# Patient Record
Sex: Female | Born: 1970 | Race: White | Hispanic: No | Marital: Married | Smoking: Former smoker
Health system: Southern US, Community
[De-identification: ages and names within clinical notes are randomized; demographics above are authoritative.]

## PROBLEM LIST (undated history)

## (undated) DIAGNOSIS — F329 Major depressive disorder, single episode, unspecified: Secondary | ICD-10-CM

## (undated) DIAGNOSIS — F32A Depression, unspecified: Secondary | ICD-10-CM

## (undated) DIAGNOSIS — F419 Anxiety disorder, unspecified: Secondary | ICD-10-CM

## (undated) DIAGNOSIS — L709 Acne, unspecified: Secondary | ICD-10-CM

## (undated) DIAGNOSIS — J45909 Unspecified asthma, uncomplicated: Secondary | ICD-10-CM

## (undated) DIAGNOSIS — T7840XA Allergy, unspecified, initial encounter: Secondary | ICD-10-CM

## (undated) DIAGNOSIS — G43909 Migraine, unspecified, not intractable, without status migrainosus: Secondary | ICD-10-CM

## (undated) HISTORY — DX: Allergy, unspecified, initial encounter: T78.40XA

---

## 1998-10-31 ENCOUNTER — Other Ambulatory Visit: Admission: RE | Admit: 1998-10-31 | Discharge: 1998-10-31 | Payer: Self-pay | Admitting: Internal Medicine

## 1999-06-03 HISTORY — PX: LACERATION REPAIR: SHX5168

## 2004-07-19 ENCOUNTER — Other Ambulatory Visit: Admission: RE | Admit: 2004-07-19 | Discharge: 2004-07-19 | Payer: Self-pay | Admitting: Obstetrics and Gynecology

## 2005-01-17 ENCOUNTER — Inpatient Hospital Stay (HOSPITAL_COMMUNITY): Admission: AD | Admit: 2005-01-17 | Discharge: 2005-01-20 | Payer: Self-pay | Admitting: Obstetrics and Gynecology

## 2005-03-10 ENCOUNTER — Other Ambulatory Visit: Admission: RE | Admit: 2005-03-10 | Discharge: 2005-03-10 | Payer: Self-pay | Admitting: Obstetrics and Gynecology

## 2013-11-30 DIAGNOSIS — F419 Anxiety disorder, unspecified: Secondary | ICD-10-CM | POA: Insufficient documentation

## 2018-05-07 ENCOUNTER — Ambulatory Visit (HOSPITAL_COMMUNITY)
Admission: EM | Admit: 2018-05-07 | Discharge: 2018-05-07 | Disposition: A | Discharge status: Home / Self Care | Payer: BC Managed Care – PPO | Source: Home / Self Care | Attending: Family Medicine | Admitting: Family Medicine

## 2018-05-07 ENCOUNTER — Encounter (HOSPITAL_COMMUNITY): Payer: Self-pay

## 2018-05-07 ENCOUNTER — Other Ambulatory Visit: Payer: Self-pay

## 2018-05-07 ENCOUNTER — Emergency Department (HOSPITAL_COMMUNITY)
Admission: EM | Admit: 2018-05-07 | Discharge: 2018-05-08 | Disposition: A | Discharge status: Home / Self Care | Payer: BC Managed Care – PPO | Attending: Emergency Medicine | Admitting: Emergency Medicine

## 2018-05-07 DIAGNOSIS — N390 Urinary tract infection, site not specified: Secondary | ICD-10-CM | POA: Insufficient documentation

## 2018-05-07 DIAGNOSIS — N12 Tubulo-interstitial nephritis, not specified as acute or chronic: Secondary | ICD-10-CM

## 2018-05-07 DIAGNOSIS — N10 Acute tubulo-interstitial nephritis: Secondary | ICD-10-CM | POA: Diagnosis not present

## 2018-05-07 DIAGNOSIS — K7689 Other specified diseases of liver: Secondary | ICD-10-CM | POA: Insufficient documentation

## 2018-05-07 DIAGNOSIS — N83202 Unspecified ovarian cyst, left side: Secondary | ICD-10-CM | POA: Insufficient documentation

## 2018-05-07 DIAGNOSIS — Z79899 Other long term (current) drug therapy: Secondary | ICD-10-CM | POA: Insufficient documentation

## 2018-05-07 DIAGNOSIS — R1032 Left lower quadrant pain: Secondary | ICD-10-CM

## 2018-05-07 DIAGNOSIS — F419 Anxiety disorder, unspecified: Secondary | ICD-10-CM

## 2018-05-07 HISTORY — DX: Depression, unspecified: F32.A

## 2018-05-07 HISTORY — DX: Anxiety disorder, unspecified: F41.9

## 2018-05-07 HISTORY — DX: Major depressive disorder, single episode, unspecified: F32.9

## 2018-05-07 LAB — POCT URINALYSIS DIP (DEVICE)
Bilirubin Urine: NEGATIVE
Glucose, UA: NEGATIVE mg/dL
Ketones, ur: NEGATIVE mg/dL
Nitrite: POSITIVE — AB
Protein, ur: 100 mg/dL — AB
Specific Gravity, Urine: 1.025 (ref 1.005–1.030)
Urobilinogen, UA: 0.2 mg/dL (ref 0.0–1.0)
pH: 6 (ref 5.0–8.0)

## 2018-05-07 LAB — COMPREHENSIVE METABOLIC PANEL
ALT: 25 U/L (ref 0–44)
AST: 25 U/L (ref 15–41)
Albumin: 3.9 g/dL (ref 3.5–5.0)
Alkaline Phosphatase: 49 U/L (ref 38–126)
Anion gap: 13 (ref 5–15)
BUN: 13 mg/dL (ref 6–20)
CO2: 18 mmol/L — ABNORMAL LOW (ref 22–32)
CREATININE: 0.96 mg/dL (ref 0.44–1.00)
Calcium: 8.8 mg/dL — ABNORMAL LOW (ref 8.9–10.3)
Chloride: 105 mmol/L (ref 98–111)
GFR calc non Af Amer: >60 mL/min (ref >60)
Glucose, Bld: 120 mg/dL — ABNORMAL HIGH (ref 70–99)
Potassium: 3.1 mmol/L — ABNORMAL LOW (ref 3.5–5.1)
Sodium: 136 mmol/L (ref 135–145)
Total Bilirubin: 0.4 mg/dL (ref 0.3–1.2)
Total Protein: 7.4 g/dL (ref 6.5–8.1)

## 2018-05-07 LAB — CBC WITH DIFFERENTIAL/PLATELET
Abs Immature Granulocytes: 0.04 10*3/uL (ref 0.00–0.07)
Basophils Absolute: 0 10*3/uL (ref 0.0–0.1)
Basophils Relative: 0 %
Eosinophils Absolute: 0 10*3/uL (ref 0.0–0.5)
Eosinophils Relative: 0 %
HCT: 38 % (ref 36.0–46.0)
Hemoglobin: 12.7 g/dL (ref 12.0–15.0)
Immature Granulocytes: 0 %
Lymphocytes Relative: 6 %
Lymphs Abs: 0.7 10*3/uL (ref 0.7–4.0)
MCH: 33.2 pg (ref 26.0–34.0)
MCHC: 33.4 g/dL (ref 30.0–36.0)
MCV: 99.2 fL (ref 80.0–100.0)
MONO ABS: 0.7 10*3/uL (ref 0.1–1.0)
Monocytes Relative: 7 %
Neutro Abs: 9.3 10*3/uL — ABNORMAL HIGH (ref 1.7–7.7)
Neutrophils Relative %: 87 %
Platelets: 189 10*3/uL (ref 150–400)
RBC: 3.83 MIL/uL — ABNORMAL LOW (ref 3.87–5.11)
RDW: 12.5 % (ref 11.5–15.5)
WBC: 10.8 10*3/uL — AB (ref 4.0–10.5)
nRBC: 0 % (ref 0.0–0.2)

## 2018-05-07 LAB — PROTIME-INR
INR: 1.22
Prothrombin Time: 15.3 seconds — ABNORMAL HIGH (ref 11.4–15.2)

## 2018-05-07 LAB — I-STAT BETA HCG BLOOD, ED (MC, WL, AP ONLY)

## 2018-05-07 LAB — I-STAT CG4 LACTIC ACID, ED: Lactic Acid, Venous: 0.82 mmol/L (ref 0.5–1.9)

## 2018-05-07 MED ORDER — MORPHINE SULFATE (PF) 4 MG/ML IV SOLN
4.0000 mg | Freq: Once | INTRAVENOUS | Status: AC
  Administered 2018-05-08: 4 mg via INTRAVENOUS
  Filled 2018-05-07: qty 1

## 2018-05-07 MED ORDER — CIPROFLOXACIN HCL 500 MG PO TABS: 500.0000 mg | ORAL_TABLET | Freq: Two times a day (BID) | ORAL | 0 refills | Status: DC

## 2018-05-07 MED ORDER — SODIUM CHLORIDE 0.9 % IV BOLUS
1000.0000 mL | Freq: Once | INTRAVENOUS | Status: AC
  Administered 2018-05-08: 1000 mL via INTRAVENOUS

## 2018-05-07 MED ORDER — ONDANSETRON HCL 4 MG/2ML IJ SOLN
4.0000 mg | Freq: Once | INTRAMUSCULAR | Status: AC
  Administered 2018-05-08: 4 mg via INTRAVENOUS
  Filled 2018-05-07: qty 2

## 2018-05-07 NOTE — ED Provider Notes (Signed)
[facility]    CSN: [id number] Arrival date & time: 05/07/18  0805     History   Chief Complaint Chief Complaint  Patient presents with  . Abdominal Pain  . Fever  . Back Pain    HPI Jenna Allison is a 47 y.o. female.   HPI  Patient states she has had some lower abdominal crampy pain for 2 to 3 days.  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  Her fever was 102.  She was achy and tired.  She went to a different urgent care center.  They did not do urine testing.  Flu test was negative.  She was advised to go to the ER for a CAT scan for her abdominal pain.  She also has some cloudy urine for the last 2 or 3 days.  Mild low back pain.  Nausea, decreased appetite.  No vomiting.  No diarrhea.  Last bowel movement was normal today.  No abdominal surgeries.  No known GYN problems, ovarian cyst.  She has an IUD.  No bleeding.  No vaginal discharge.  She has no known colon problems or diverticulitis.  No diarrhea. She has not been prone to urinary tract infections or kidney infections.  Past Medical History:  Diagnosis Date  . Anxiety   . Depression     There are no active problems to display for this patient.   History reviewed. No pertinent surgical history.  OB History   None      Home Medications    Prior to Admission medications   Medication Sig Start Date End Date Taking? Authorizing Provider  benztropine (COGENTIN) 0.5 MG tablet Take 0.5 mg by mouth 2 (two) times daily.   Yes [provider]  buPROPion (WELLBUTRIN) 100 MG tablet Take 20 mg by mouth 2 (two) times daily.   Yes [provider]  cetirizine (ZYRTEC) 10 MG chewable tablet Chew 10 mg by mouth daily.   Yes [provider]  citalopram (CELEXA) 20 MG tablet Take 20 mg by mouth daily.   Yes [provider]  ciprofloxacin (CIPRO) 500 MG tablet Take 1 tablet (500 mg total) by mouth every 12 (twelve) hours. 05/07/18   Eustace MooreNelson,  Virginia Curl Sue, MD    Family History Family History  Problem Relation Age of Onset  . Healthy Mother     Social History Social History   Tobacco Use  . Smoking status: Never Smoker  . Smokeless tobacco: Never Used  Substance Use Topics  . Alcohol use: Never    Frequency: Never  . Drug use: Never     Allergies   Patient has no known allergies.   Review of Systems Review of Systems  Constitutional: Positive for appetite change, fatigue and fever. Negative for chills.  HENT: Negative for congestion, ear pain and sore throat.   Eyes: Negative for pain and visual disturbance.  Respiratory: Negative for cough and shortness of breath.   Cardiovascular: Negative for chest pain and palpitations.  Gastrointestinal: Positive for abdominal pain. Negative for vomiting.  Genitourinary: Positive for dysuria and frequency. Negative for hematuria, menstrual problem, pelvic pain, vaginal bleeding and vaginal discharge.  Musculoskeletal: Negative for arthralgias and back pain.  Skin: Negative for color change and rash.  Neurological: Negative for seizures and syncope.  Psychiatric/Behavioral: Negative for dysphoric mood. The patient is not nervous/anxious.   All other systems reviewed and are negative.    Physical Exam Triage Vital  Signs ED Triage Vitals  Enc Vitals Group     BP 05/07/18 0829 (!) 106/50     Pulse Rate 05/07/18 0829 90     Resp 05/07/18 0829 18     Temp 05/07/18 0829 98.7 F (37.1 C)     Temp src --      SpO2 05/07/18 0829 100 %     Weight --      Height --      Head Circumference --      Peak Flow --      Pain Score 05/07/18 0826 7     Pain Loc --      Pain Edu? --      Excl. in GC? --    No data found.  Updated Vital Signs BP (!) 106/50   Pulse 90   Temp 98.7 F (37.1 C)   Resp 18   SpO2 100%      Physical Exam  Constitutional: She is oriented to person, place, and time. She appears well-developed and well-nourished. She appears ill. No distress.    HENT:  Head: Normocephalic and atraumatic.  Mouth/Throat: Oropharynx is clear and moist.  Eyes: Pupils are equal, round, and reactive to light. Conjunctivae are normal.  Neck: Normal range of motion.  Cardiovascular: Normal rate, regular rhythm and normal heart sounds.  Pulmonary/Chest: Effort normal and breath sounds normal. No respiratory distress.  Abdominal: Soft. Normal appearance and bowel sounds are normal. She exhibits no distension. There is no hepatosplenomegaly. There is tenderness in the left lower quadrant. There is guarding.  Abdomen is quite tender in the lower quadrants, most pronounced in the left lower quadrant.  She has some guarding that prevents deep palpation.  No mass palpable.  Mild rebound tenderness.  Mild suprapubic tenderness.  No right lower quadrant tenderness.  No real CVA tenderness but does have tenderness to deep palpation of the left low back and SI region.  Musculoskeletal: Normal range of motion. She exhibits no edema.  Neurological: She is alert and oriented to person, place, and time.  Skin: Skin is warm and dry.  Psychiatric: She has a normal mood and affect. Her behavior is normal.     UC Treatments / Results  Labs (all labs ordered are listed, but only abnormal results are displayed) Labs Reviewed  POCT URINALYSIS DIP (DEVICE) - Abnormal; Notable for the following components:      Result Value   Hgb urine dipstick MODERATE (*)    Protein, ur 100 (*)    Nitrite POSITIVE (*)    Leukocytes, UA LARGE (*)    All other components within normal limits  URINE CULTURE    EKG None  Radiology No results found.  Procedures Procedures (including critical care time)  Medications Ordered in UC Medications - No data to display  Initial Impression / Assessment and Plan / UC Course  I have reviewed the triage vital signs and the nursing notes.  Pertinent labs & imaging results that were available during my care of the patient were reviewed by me  and considered in my medical decision making (see chart for details).     I explained to the patient that it is unusual to have a fever of 102 with just a bladder infection.  In addition, bladder infections do not normally have quite this much abdominal pain and tenderness.  She does clearly have a urinary infection based on her urinalysis.  Culture is sent.  Since she is able to keep down  food and fluids, and she is wishing to go home, I will allow her to go home with antibiotics, fluids, Tylenol for pain.  She is cautioned that if her pain gets worse, fever continues to spike, or she starts with nausea vomiting she must go to the ER.  She likely has a urinary tract infection although atypical symptoms of abdominal pain. Final Clinical Impressions(s) / UC Diagnoses   Final diagnoses:  Lower urinary tract infectious disease     Discharge Instructions     Push fluids Take acetaminophen for pain and fever Take the cipro 2 x a day for 7 days Go to ER if worsening pain, fever or if you develop vomiting and inability to keep medicine/fluids down We did lab testing during this visit. (culture)   If there are any abnormal findings that require change in medicine or indicate a positive result, you will be notified.  If all of your tests are normal, you will not be called.      ED Prescriptions    Medication Sig Dispense Auth. Provider   ciprofloxacin (CIPRO) 500 MG tablet Take 1 tablet (500 mg total) by mouth every 12 (twelve) hours. 14 tablet Eustace Moore, MD     Controlled Substance Prescriptions [place] Controlled Substance Registry consulted? Not Applicable   Eustace Moore, MD 05/07/18 [phone]

## 2018-05-07 NOTE — ED Notes (Signed)
Pt dizzy and unstable while standing for OSVS

## 2018-05-07 NOTE — ED Triage Notes (Signed)
Pt presents with complaints of lower abdominal pain all over, lower back pain, fever and cloudy urine x 3 days.

## 2018-05-07 NOTE — ED Provider Notes (Signed)
[facility]EMERGENCY DEPT Provider Note   CSN: [id number] Arrival date & time: 05/07/18  2125     History   Chief Complaint Chief Complaint  Patient presents with  . Abdominal Pain  . Fever    HPI Jenna Allison is a 47 y.o. female with no major medical problems presents to the Emergency Department complaining of gradual, persistent, progressively worsening left lower quadrant abdominal pain onset 4-5 days ago.  She reports a gradual worsening of the sharp pain now at an 8/10.  Reports 24 hours of fever to 102 at home with associated myalgias and fatigue.  Patient was seen at an urgent care this morning with urinalysis and given prescription for Cipro.  Patient has had continued worsening of pain and new development of lightheadedness at home.  She reports continued fevers today.  Patient reports cloudy urine for last several days with mild, low back pain nausea and decreased appetite.  She reports that she did begin to have dysuria today.  She denies vomiting, diarrhea.  No history of abdominal surgeries.  No vaginal discharge.  Patient is sexually active with one female partner and uses an IUD for contraception.  She denies vaginal bleeding.  No history of diverticulitis.  Patient denies a history of recurrent urinary tract infections.  The history is provided by the patient and medical records. No language interpreter was used.    Past Medical History:  Diagnosis Date  . Anxiety   . Depression     There are no active problems to display for this patient.   History reviewed. No pertinent surgical history.   OB History   None      Home Medications    Prior to Admission medications   Medication Sig Start Date End Date Taking? Authorizing Provider  benztropine (COGENTIN) 0.5 MG tablet Take 0.5 mg by mouth daily.    Yes [provider]  buPROPion (WELLBUTRIN XL) 150 MG 24 hr tablet Take 150 mg by mouth daily.  05/03/18  Yes [provider]   cetirizine (ZYRTEC) 10 MG chewable tablet Chew 10 mg by mouth daily.   Yes [provider]  ciprofloxacin (CIPRO) 500 MG tablet Take 1 tablet (500 mg total) by mouth every 12 (twelve) hours. 05/07/18  Yes Eustace MooreNelson, Yvonne Sue, MD  citalopram (CELEXA) 40 MG tablet Take 40 mg by mouth daily.  05/03/18  Yes [provider]  fluticasone (FLONASE) 50 MCG/ACT nasal spray Place 1 spray into both nostrils daily.   Yes [provider]  LORazepam (ATIVAN) 0.5 MG tablet Take 0.5 mg by mouth 2 (two) times daily as needed for anxiety.  03/17/18  Yes [provider]  Multiple Vitamins-Minerals (MULTIVITAMIN ADULT) TABS Take 1 tablet by mouth daily.   Yes [provider]  cephALEXin (KEFLEX) 500 MG capsule Take 1 capsule (500 mg total) by mouth 4 (four) times daily. 05/08/18   Brandell Maready, Dahlia ClientHannah, PA-C    Family History Family History  Problem Relation Age of Onset  . Healthy Mother     Social History Social History   Tobacco Use  . Smoking status: Never Smoker  . Smokeless tobacco: Never Used  Substance Use Topics  . Alcohol use: Never    Frequency: Never  . Drug use: Never     Allergies   Patient has no known allergies.   Review of Systems Review of Systems  Constitutional: Positive for appetite change, fatigue and fever. Negative for diaphoresis and unexpected weight change.  HENT:  Negative for mouth sores.   Eyes: Negative for visual disturbance.  Respiratory: Negative for cough, chest tightness, shortness of breath and wheezing.   Cardiovascular: Negative for chest pain.  Gastrointestinal: Positive for abdominal pain and nausea. Negative for constipation, diarrhea and vomiting.  Endocrine: Negative for polydipsia, polyphagia and polyuria.  Genitourinary: Positive for dysuria. Negative for frequency, hematuria and urgency.  Musculoskeletal: Positive for back pain. Negative for neck stiffness.  Skin: Negative for rash.  Allergic/Immunologic:  Negative for immunocompromised state.  Neurological: Positive for light-headedness. Negative for syncope and headaches.  Hematological: Does not bruise/bleed easily.  Psychiatric/Behavioral: Negative for sleep disturbance. The patient is not nervous/anxious.      Physical Exam Updated Vital Signs BP 113/65 (BP Location: Left Arm)   Pulse 100   Temp 99.1 F (37.3 C) (Oral)   Resp 18   Ht 5\' 9"  (1.753 m)   Wt 66.2 kg   SpO2 100%   BMI 21.56 kg/m   Physical Exam  Constitutional: She appears well-developed and well-nourished. No distress.  Awake, alert, nontoxic appearance Very uncomfortable appearing  HENT:  Head: Normocephalic and atraumatic.  Mouth/Throat: Oropharynx is clear and moist. No oropharyngeal exudate.  Eyes: Conjunctivae are normal. No scleral icterus.  Neck: Normal range of motion. Neck supple.  Cardiovascular: Normal rate, regular rhythm and intact distal pulses.  Pulmonary/Chest: Effort normal and breath sounds normal. No respiratory distress. She has no wheezes.  Equal chest expansion  Abdominal: Soft. Bowel sounds are normal. She exhibits no mass. There is tenderness in the left lower quadrant. There is rebound and guarding. There is no rigidity and no CVA tenderness.  No significant CVA tenderness.  Patient does have left lower quadrant guarding and rebound.  Musculoskeletal: Normal range of motion. She exhibits no edema.  Full range of motion of the T-spine and L-spine. Moderate pain to palpation of the paraspinal muscles of the L-spine.  Neurological: She is alert.  Speech is clear and goal oriented Moves extremities without ataxia  Skin: Skin is warm and dry. She is not diaphoretic.  Psychiatric: She has a normal mood and affect.  Nursing note and vitals reviewed.    ED Treatments / Results  Labs (all labs ordered are listed, but only abnormal results are displayed) Labs Reviewed  COMPREHENSIVE METABOLIC PANEL - Abnormal; Notable for the following  components:      Result Value   Potassium 3.1 (*)    CO2 18 (*)    Glucose, Bld 120 (*)    Calcium 8.8 (*)    All other components within normal limits  CBC WITH DIFFERENTIAL/PLATELET - Abnormal; Notable for the following components:   WBC 10.8 (*)    RBC 3.83 (*)    Neutro Abs 9.3 (*)    All other components within normal limits  PROTIME-INR - Abnormal; Notable for the following components:   Prothrombin Time 15.3 (*)    All other components within normal limits  CULTURE, BLOOD (ROUTINE X 2)  CULTURE, BLOOD (ROUTINE X 2)  URINALYSIS, ROUTINE W REFLEX MICROSCOPIC  I-STAT CG4 LACTIC ACID, ED  I-STAT BETA HCG BLOOD, ED ([facility], [facility], [facility] ONLY)    Radiology Ct Abdomen Pelvis W Contrast  Result Date: 05/08/2018 CLINICAL DATA:  47 year old female with abdominal pain. Concern for acute diverticulitis. EXAM: CT ABDOMEN AND PELVIS WITH CONTRAST TECHNIQUE: Multidetector CT imaging of the abdomen and pelvis was performed using the standard protocol following bolus administration of intravenous contrast. CONTRAST:  ISOVUE-300 IOPAMIDOL (ISOVUE-300) INJECTION 61% COMPARISON:  None. FINDINGS: Lower chest: Possible trace left pleural effusion. The visualized lung bases are clear. No intra-abdominal free air. Small free fluid within the pelvis. Hepatobiliary: There is a 1.5 cm cyst in the right lobe of the liver. The liver is otherwise unremarkable. No intrahepatic biliary ductal dilatation. The gallbladder is unremarkable. Pancreas: Unremarkable. No pancreatic ductal dilatation or surrounding inflammatory changes. Spleen: Normal in size without focal abnormality. Adrenals/Urinary Tract: The adrenal glands are unremarkable. Patchy areas of decreased enhancement primarily in the lower pole of the left kidney as well as in the left renal upper pole most consistent with pyelonephritis. Correlation with clinical exam and urinalysis recommended. Follow-up with ultrasound after resolution of symptoms recommended  to exclude underlying mass. The right kidney is unremarkable. The visualized ureters and urinary bladder are unremarkable. Stomach/Bowel: There is moderate stool throughout the colon. No bowel obstruction or active inflammation. The appendix is not visualized with certainty. No inflammatory changes identified in the right lower quadrant. Vascular/Lymphatic: No significant vascular findings are present. No enlarged abdominal or pelvic lymph nodes. Reproductive: The uterus is unremarkable. An intrauterine device is noted. There is a 1 cm corpus luteum in the right ovary. There is a 3 cm cyst in the left ovary. Other: Small fat containing umbilical hernia. Musculoskeletal: No acute or significant osseous findings. IMPRESSION: 1. Findings most consistent with left-sided pyelonephritis. Correlation with clinical exam and urinalysis recommended. Follow-up with ultrasound after resolution of symptoms recommended to exclude underlying mass. 2. No bowel obstruction or active inflammation. 3. A 3 cm left ovarian cyst. Electronically Signed   By: Elgie Collard M.D.   On: 05/08/2018 01:16    Procedures Procedures (including critical care time)  Medications Ordered in ED Medications  iopamidol (ISOVUE-300) 61 % injection (has no administration in time range)  sodium chloride (PF) 0.9 % injection (has no administration in time range)  sodium chloride 0.9 % bolus 500 mL (500 mLs Intravenous New Bag/Given 05/08/18 0215)  oxyCODONE-acetaminophen (PERCOCET/ROXICET) 5-325 MG per tablet 2 tablet (has no administration in time range)  sodium chloride 0.9 % bolus 1,000 mL (0 mLs Intravenous Stopped 05/08/18 0107)  ondansetron (ZOFRAN) injection 4 mg (4 mg Intravenous Given 05/08/18 0002)  morphine 4 MG/ML injection 4 mg (4 mg Intravenous Given 05/08/18 0002)  iopamidol (ISOVUE-300) 61 % injection 100 mL (100 mLs Intravenous Contrast Given 05/08/18 0013)  cefTRIAXone (ROCEPHIN) 1 g in sodium chloride 0.9 % 100 mL IVPB (0 g  Intravenous Stopped 05/08/18 0216)  potassium chloride SA (K-DUR,KLOR-CON) CR tablet 40 mEq (40 mEq Oral Given 05/08/18 0213)     Initial Impression / Assessment and Plan / ED Course  I have reviewed the triage vital signs and the nursing notes.  Pertinent labs & imaging results that were available during my care of the patient were reviewed by me and considered in my medical decision making (see chart for details).  Clinical Course as of May 08 225  Sat May 08, 2018  0159 Within normal limits  Lactic Acid, Venous: 0.82 [HM]  0159 Mild hypokalemia, potassium given.  Potassium(!): 3.1 [HM]  0220 Patient reports her pain has improved significantly.  Does continue to have pain in her low back and left lower quadrant.  Abdomen is soft still with mild guarding but no rebound.   [HM]    Clinical Course User Index [HM] Lexie Koehl, Boyd Kerbs    Presents with left lower quadrant abdominal pain, some urinary symptoms.  Urinalysis from this morning shows clear evidence of urinary tract  infection.  On exam she is uncomfortable without significant CVA tenderness as would be expected for pyelonephritis.  She does have significant left lower quadrant abdominal tenderness with rebound and guarding.  Concern for atypical abdominal pain.  Patient has no history of nephrolithiasis.    Orthostatic VS for the past 24 hrs:  BP- Lying Pulse- Lying BP- Sitting Pulse- Sitting BP- Standing at 0 minutes Pulse- Standing at 0 minutes  05/07/18 2323 95/55 78 100/50 84 93/44 86     Labs reassuring.  Mild leukocytosis noted.  Patient does have hypokalemia and potassium was given here in the emergency department.  CT scan shows left-sided hydronephrosis but no evidence of diverticulitis, bowel perforation.  IUD appears to be in correct place.  Patient does have a 3cm left ovarian cyst however this is less likely to be the source of patient's pain.  No radiographic evidence for ovarian torsion on CT scan.  I  personally evaluated these images.  patient given Rocephin here in the emergency department.  She is feeling some better.  Her vital signs have remained stable throughout her time here in the emergency department and there is no evidence of sirs or sepsis.  Patient is to stop Cipro and will be given Keflex.  Discussed strict return precautions including persistent fevers, development of vomiting, worsening pain or other symptoms.  Patient states understanding and is in agreement with this plan.  Final Clinical Impressions(s) / ED Diagnoses   Final diagnoses:  Pyelonephritis of left kidney  LLQ abdominal pain  Left ovarian cyst  Liver cyst    ED Discharge Orders         Ordered    cephALEXin (KEFLEX) 500 MG capsule  4 times daily     05/08/18 0225           Kolden Dupee, Dahlia Client, PA-C 05/08/18 0227    Nira Conn, MD 05/08/18 [phone]

## 2018-05-07 NOTE — ED Triage Notes (Signed)
PT C/O LL ABDOMINAL PAIN SINCE Monday. PT WENT TO AN UCC YESTERDAY, THEN AGAIN THIS MORNING DUE TO HER SYMPTOMS GETTING WORSE. PT DX'D WITH A KIDNEY INFECTION, AND GIVEN A RX FOR ABX. PT HAS TAKEN 2 DOSES OF CIPRO TODAY. THE FEVER HAS NEVER SUBSIDED, AND NOW SHE HAS BLURRED VISION AND WEAKNESS. PT STS SHE DOES NOW HAVE CLOUDY URINE AND BURNING WITH URINATION.

## 2018-05-07 NOTE — Discharge Instructions (Addendum)
Push fluids Take acetaminophen for pain and fever Take the cipro 2 x a day for 7 days Go to ER if worsening pain, fever or if you develop vomiting and inability to keep medicine/fluids down We did lab testing during this visit. (culture)   If there are any abnormal findings that require change in medicine or indicate a positive result, you will be notified.  If all of your tests are normal, you will not be called.

## 2018-05-08 ENCOUNTER — Encounter (HOSPITAL_COMMUNITY): Payer: Self-pay

## 2018-05-08 ENCOUNTER — Emergency Department (HOSPITAL_COMMUNITY): Payer: BC Managed Care – PPO

## 2018-05-08 LAB — URINALYSIS, ROUTINE W REFLEX MICROSCOPIC
Bilirubin Urine: NEGATIVE
Glucose, UA: NEGATIVE mg/dL
Ketones, ur: 20 mg/dL — AB
Nitrite: NEGATIVE
PROTEIN: 30 mg/dL — AB
Specific Gravity, Urine: >1.046 — ABNORMAL HIGH (ref 1.005–1.030)
Squamous Epithelial / HPF: >50 — ABNORMAL HIGH (ref 0–5)
pH: 5 (ref 5.0–8.0)

## 2018-05-08 MED ORDER — POTASSIUM CHLORIDE CRYS ER 20 MEQ PO TBCR
40.0000 meq | EXTENDED_RELEASE_TABLET | Freq: Once | ORAL | Status: AC
  Administered 2018-05-08: 40 meq via ORAL
  Filled 2018-05-08: qty 2

## 2018-05-08 MED ORDER — SODIUM CHLORIDE (PF) 0.9 % IJ SOLN
INTRAMUSCULAR | Status: AC
  Filled 2018-05-08: qty 50

## 2018-05-08 MED ORDER — OXYCODONE-ACETAMINOPHEN 5-325 MG PO TABS
2.0000 | ORAL_TABLET | Freq: Once | ORAL | Status: AC
  Administered 2018-05-08: 2 via ORAL
  Filled 2018-05-08: qty 2

## 2018-05-08 MED ORDER — SODIUM CHLORIDE 0.9 % IV SOLN
1.0000 g | Freq: Once | INTRAVENOUS | Status: AC
  Administered 2018-05-08: 1 g via INTRAVENOUS
  Filled 2018-05-08: qty 10

## 2018-05-08 MED ORDER — IOPAMIDOL (ISOVUE-300) INJECTION 61%
100.0000 mL | Freq: Once | INTRAVENOUS | Status: AC | PRN
  Administered 2018-05-08: 100 mL via INTRAVENOUS

## 2018-05-08 MED ORDER — CEPHALEXIN 500 MG PO CAPS: 500.0000 mg | ORAL_CAPSULE | Freq: Four times a day (QID) | ORAL | 0 refills | Status: DC

## 2018-05-08 MED ORDER — IOPAMIDOL (ISOVUE-300) INJECTION 61%
INTRAVENOUS | Status: AC
  Filled 2018-05-08: qty 100

## 2018-05-08 MED ORDER — SODIUM CHLORIDE 0.9 % IV BOLUS
500.0000 mL | Freq: Once | INTRAVENOUS | Status: AC
  Administered 2018-05-08: 500 mL via INTRAVENOUS

## 2018-05-08 NOTE — ED Notes (Signed)
Pt able to tolerate PO without vomiting. 

## 2018-05-08 NOTE — Discharge Instructions (Addendum)
1. Medications:Keflex, usual home medications 2. Treatment: rest, drink plenty of fluids, take medications as prescribed 3. Follow Up: Please followup with your primary doctor in 1-3 days for discussion of your diagnoses and further evaluation after today's visit; if you do not have a primary care doctor use the resource guide provided to find one; return to the ER for persistent fevers, persistent vomiting, worsening abdominal pain or other concerning symptoms.

## 2018-05-08 NOTE — ED Notes (Signed)
Pt wheeled to the restroom. She still feels unsteady on her feet.

## 2018-05-08 NOTE — ED Notes (Signed)
Pt verbalized discharge instructions and follow up care. Husband will be driving her home.

## 2018-05-09 LAB — URINE CULTURE

## 2018-05-10 ENCOUNTER — Telehealth (HOSPITAL_COMMUNITY): Payer: Self-pay | Admitting: Emergency Medicine

## 2018-05-10 NOTE — Telephone Encounter (Signed)
Urine culture was positive for ecoli and was given cipro  at urgent care visit. Pt was then seen in the ER, and given keflex. Keflex will treat UTI. Attempted to reach patient. No answer at this time.

## 2018-05-12 LAB — CULTURE, BLOOD (ROUTINE X 2)
Culture: NO GROWTH
Special Requests: ADEQUATE

## 2018-05-13 LAB — CULTURE, BLOOD (ROUTINE X 2): Culture: NO GROWTH

## 2018-07-23 ENCOUNTER — Emergency Department (HOSPITAL_COMMUNITY)
Admission: EM | Admit: 2018-07-23 | Discharge: 2018-07-23 | Disposition: A | Discharge status: Home / Self Care | Payer: BC Managed Care – PPO | Attending: Emergency Medicine | Admitting: Emergency Medicine

## 2018-07-23 ENCOUNTER — Other Ambulatory Visit: Payer: Self-pay

## 2018-07-23 ENCOUNTER — Emergency Department (HOSPITAL_COMMUNITY): Payer: BC Managed Care – PPO

## 2018-07-23 ENCOUNTER — Encounter (HOSPITAL_COMMUNITY): Payer: Self-pay | Admitting: Emergency Medicine

## 2018-07-23 DIAGNOSIS — J45901 Unspecified asthma with (acute) exacerbation: Secondary | ICD-10-CM

## 2018-07-23 DIAGNOSIS — R0602 Shortness of breath: Secondary | ICD-10-CM | POA: Diagnosis present

## 2018-07-23 DIAGNOSIS — J40 Bronchitis, not specified as acute or chronic: Secondary | ICD-10-CM

## 2018-07-23 DIAGNOSIS — Z79899 Other long term (current) drug therapy: Secondary | ICD-10-CM | POA: Insufficient documentation

## 2018-07-23 DIAGNOSIS — J069 Acute upper respiratory infection, unspecified: Secondary | ICD-10-CM | POA: Diagnosis not present

## 2018-07-23 DIAGNOSIS — B9789 Other viral agents as the cause of diseases classified elsewhere: Secondary | ICD-10-CM

## 2018-07-23 LAB — CBC WITH DIFFERENTIAL/PLATELET
Abs Immature Granulocytes: 0.04 10*3/uL (ref 0.00–0.07)
Basophils Absolute: 0 10*3/uL (ref 0.0–0.1)
Basophils Relative: 0 %
Eosinophils Absolute: 0.1 10*3/uL (ref 0.0–0.5)
Eosinophils Relative: 1 %
HCT: 41.8 % (ref 36.0–46.0)
HEMOGLOBIN: 13.8 g/dL (ref 12.0–15.0)
Immature Granulocytes: 0 %
Lymphocytes Relative: 5 %
Lymphs Abs: 0.4 10*3/uL — ABNORMAL LOW (ref 0.7–4.0)
MCH: 32.9 pg (ref 26.0–34.0)
MCHC: 33 g/dL (ref 30.0–36.0)
MCV: 99.8 fL (ref 80.0–100.0)
MONOS PCT: 2 %
Monocytes Absolute: 0.2 10*3/uL (ref 0.1–1.0)
Neutro Abs: 8.3 10*3/uL — ABNORMAL HIGH (ref 1.7–7.7)
Neutrophils Relative %: 92 %
Platelets: 216 10*3/uL (ref 150–400)
RBC: 4.19 MIL/uL (ref 3.87–5.11)
RDW: 12.6 % (ref 11.5–15.5)
WBC: 9 10*3/uL (ref 4.0–10.5)
nRBC: 0 % (ref 0.0–0.2)

## 2018-07-23 LAB — BASIC METABOLIC PANEL
Anion gap: 9 (ref 5–15)
BUN: 12 mg/dL (ref 6–20)
CO2: 20 mmol/L — ABNORMAL LOW (ref 22–32)
Calcium: 9 mg/dL (ref 8.9–10.3)
Chloride: 108 mmol/L (ref 98–111)
Creatinine, Ser: 0.86 mg/dL (ref 0.44–1.00)
GFR calc Af Amer: >60 mL/min (ref >60)
GFR calc non Af Amer: >60 mL/min (ref >60)
GLUCOSE: 120 mg/dL — AB (ref 70–99)
Potassium: 4.4 mmol/L (ref 3.5–5.1)
Sodium: 137 mmol/L (ref 135–145)

## 2018-07-23 LAB — INFLUENZA PANEL BY PCR (TYPE A & B)
Influenza A By PCR: NEGATIVE
Influenza B By PCR: NEGATIVE

## 2018-07-23 MED ORDER — ALBUTEROL SULFATE HFA 108 (90 BASE) MCG/ACT IN AERS: 2.0000 | INHALATION_SPRAY | RESPIRATORY_TRACT | 0 refills | Status: DC | PRN

## 2018-07-23 MED ORDER — PROMETHAZINE-DM 6.25-15 MG/5ML PO SYRP: 5.0000 mL | ORAL_SOLUTION | Freq: Four times a day (QID) | ORAL | 0 refills | Status: DC | PRN

## 2018-07-23 MED ORDER — IOPAMIDOL (ISOVUE-370) INJECTION 76%
INTRAVENOUS | Status: AC
  Filled 2018-07-23: qty 100

## 2018-07-23 MED ORDER — IOPAMIDOL (ISOVUE-370) INJECTION 76%
80.0000 mL | Freq: Once | INTRAVENOUS | Status: AC | PRN
  Administered 2018-07-23: 80 mL via INTRAVENOUS

## 2018-07-23 MED ORDER — SODIUM CHLORIDE 0.9 % IV BOLUS
1000.0000 mL | Freq: Once | INTRAVENOUS | Status: AC
  Administered 2018-07-23: 1000 mL via INTRAVENOUS

## 2018-07-23 MED ORDER — ALBUTEROL (5 MG/ML) CONTINUOUS INHALATION SOLN
10.0000 mg/h | INHALATION_SOLUTION | RESPIRATORY_TRACT | Status: DC
  Administered 2018-07-23: 10 mg/h via RESPIRATORY_TRACT
  Filled 2018-07-23: qty 20

## 2018-07-23 MED ORDER — ALBUTEROL SULFATE (2.5 MG/3ML) 0.083% IN NEBU
5.0000 mg | INHALATION_SOLUTION | Freq: Once | RESPIRATORY_TRACT | Status: AC
  Administered 2018-07-23: 5 mg via RESPIRATORY_TRACT
  Filled 2018-07-23: qty 6

## 2018-07-23 MED ORDER — KETOROLAC TROMETHAMINE 60 MG/2ML IM SOLN
60.0000 mg | Freq: Once | INTRAMUSCULAR | Status: AC
  Administered 2018-07-23: 60 mg via INTRAMUSCULAR
  Filled 2018-07-23: qty 2

## 2018-07-23 MED ORDER — PREDNISONE 50 MG PO TABS: 50.0000 mg | ORAL_TABLET | Freq: Every day | ORAL | 0 refills | Status: DC

## 2018-07-23 MED ORDER — IBUPROFEN 800 MG PO TABS
800.0000 mg | ORAL_TABLET | Freq: Once | ORAL | Status: AC
  Administered 2018-07-23: 800 mg via ORAL
  Filled 2018-07-23: qty 1

## 2018-07-23 MED ORDER — IPRATROPIUM-ALBUTEROL 0.5-2.5 (3) MG/3ML IN SOLN
3.0000 mL | Freq: Once | RESPIRATORY_TRACT | Status: AC
  Administered 2018-07-23: 3 mL via RESPIRATORY_TRACT
  Filled 2018-07-23: qty 3

## 2018-07-23 MED ORDER — AEROCHAMBER PLUS FLO-VU MEDIUM MISC: 1.0000 | Freq: Once | Status: DC

## 2018-07-23 MED ORDER — GUAIFENESIN ER 1200 MG PO TB12: 1.0000 | ORAL_TABLET | Freq: Two times a day (BID) | ORAL | 0 refills | Status: DC

## 2018-07-23 MED ORDER — SODIUM CHLORIDE (PF) 0.9 % IJ SOLN
INTRAMUSCULAR | Status: AC
  Filled 2018-07-23: qty 50

## 2018-07-23 NOTE — ED Triage Notes (Signed)
The patient came from urgent care due to difficulty breathing and cough that started 2 nights ago and worsened last night. There they found that her O2 sats were low. They gave her ibuprofen, solu- medrol and Duoneb. She was then sent here.

## 2018-07-23 NOTE — ED Notes (Signed)
RN ambulated patient in hall on pulse ox. Patient's O2 saturation decreased to 89-93% during ambulation. RN made PA aware

## 2018-07-23 NOTE — ED Notes (Signed)
Bed: WA06 Expected date:  Expected time:  Means of arrival:  Comments: Hold for triage 6 

## 2018-07-23 NOTE — Discharge Instructions (Addendum)
Return here for any worsening in your condition. Follow up with your primary doctor. INcrease your fluid intake.

## 2018-07-23 NOTE — ED Provider Notes (Signed)
Patient was given another nebulizer treatment and was able to maintain her oxygen saturations above 94%.  Patient mostly staying in the upper 90s however during this.  Does still have wheezing but is improved with her oxygenation.  Is not showing any signs of significant respiratory distress.   Charlestine Night, PA-C 07/23/18 2004    Virgina Norfolk, DO 07/23/18 2018

## 2018-07-23 NOTE — ED Provider Notes (Signed)
[facility]   CSN: [id number] Arrival date & time: 07/23/18  1120    History   Chief Complaint Chief Complaint  Patient presents with  . Cough  . Shortness of Breath    HPI Jenna Allison is a 48 y.o. female.      Jenna Allison is a 48 y.o. female with history of asthma, anxiety and depression, who presents to the emergency department from urgent care for evaluation of shortness of breath, difficulty breathing and cough.  She reports symptoms started 2 nights ago but became significantly worse last night.  She went into urgent care today and was noted to have low oxygen sats, as low as 89% at urgent care, they gave her ibuprofen, Solu-Medrol and a DuoNeb and she was sent here for further evaluation.  She reports that for the past 3 days she has had intermittent body aches, congestion, cough that has occasionally been productive, low-grade fevers and chills.  She has had worsening wheezing at night.  Reports that she had severe asthma as a child but has not had issues with her asthma in many years, did not have an inhaler at home to use for the symptoms.  She denies any associated chest pain but reports some chest tightness with breathing.  No abdominal pain, nausea or vomiting.  She denies any lower extremity swelling or pain, no recent long distance travel, hospitalizations or surgeries, no history of blood clots.  No other aggravating or alleviating factors.  No Smoking history.     Past Medical History:  Diagnosis Date  . Anxiety   . Depression     There are no active problems to display for this patient.   History reviewed. No pertinent surgical history.   OB History   No obstetric history on file.      Home Medications    Prior to Admission medications   Medication Sig Start Date End Date Taking? Authorizing Provider  benztropine (COGENTIN) 0.5 MG tablet Take 0.5 mg by mouth daily.    Yes [provider]    buPROPion (WELLBUTRIN XL) 150 MG 24 hr tablet Take 150 mg by mouth daily.  05/03/18  Yes [provider]  cetirizine (ZYRTEC) 10 MG tablet Take 10 mg by mouth daily.   Yes [provider]  citalopram (CELEXA) 40 MG tablet Take 40 mg by mouth daily.  05/03/18  Yes [provider]  fluticasone (FLONASE) 50 MCG/ACT nasal spray Place 1 spray into both nostrils daily.   Yes [provider]  LORazepam (ATIVAN) 0.5 MG tablet Take 0.5 mg by mouth 2 (two) times daily as needed for anxiety.  03/17/18  Yes [provider]  Multiple Vitamins-Minerals (MULTIVITAMIN ADULT) TABS Take 1 tablet by mouth daily.   Yes [provider]  spironolactone (ALDACTONE) 50 MG tablet Take 50 mg by mouth daily. 06/25/18  Yes [provider]  tazarotene (AVAGE) 0.1 % cream Apply 1 application topically 3 (three) times a week. 05/28/18  Yes [provider]  cephALEXin (KEFLEX) 500 MG capsule Take 1 capsule (500 mg total) by mouth 4 (four) times daily. Patient not taking: Reported on 07/23/2018 05/08/18   Muthersbaugh, Dahlia Client, PA-C  ciprofloxacin (CIPRO) 500 MG tablet Take 1 tablet (500 mg total) by mouth every 12 (twelve) hours. Patient not taking: Reported on 07/23/2018 05/07/18   Eustace Moore, MD    Family History Family History  Problem Relation Age of Onset  . Healthy Mother  Social History Social History   Tobacco Use  . Smoking status: Never Smoker  . Smokeless tobacco: Never Used  Substance Use Topics  . Alcohol use: Never    Frequency: Never  . Drug use: Never     Allergies   Patient has no known allergies.   Review of Systems Review of Systems  Constitutional: Positive for chills, fatigue and fever.  HENT: Positive for congestion, postnasal drip and rhinorrhea. Negative for ear pain and sore throat.   Respiratory: Positive for cough, chest tightness, shortness of breath and wheezing.   Cardiovascular: Negative for chest  pain and leg swelling.  Gastrointestinal: Negative for abdominal pain, constipation, diarrhea, nausea and vomiting.  Genitourinary: Negative for dysuria and frequency.  Musculoskeletal: Negative for arthralgias and myalgias.  Skin: Negative for color change and rash.  Neurological: Negative for dizziness, syncope and light-headedness.  All other systems reviewed and are negative.    Physical Exam Updated Vital Signs BP 106/68   Pulse (!) 109   Temp 98.8 F (37.1 C) (Oral)   Resp (!) 22   Ht 5\' 9"  (1.753 m)   Wt 65.8 kg   SpO2 94%   BMI 21.41 kg/m   Physical Exam Vitals signs and nursing note reviewed.  Constitutional:      General: She is not in acute distress.    Appearance: She is well-developed and normal weight. She is ill-appearing. She is not diaphoretic.     Comments: Patient is somewhat ill-appearing, tachypneic some increased work of breathing but is not in acute distress  HENT:     Head: Normocephalic and atraumatic.     Nose: Congestion and rhinorrhea present.     Comments: Bilateral nares patent with moderate mucosal edema and clear rhinorrhea present.     Mouth/Throat:     Mouth: Mucous membranes are moist.     Pharynx: Oropharynx is clear.     Comments: Posterior oropharynx clear and mucous membranes moist, there is mild erythema but no edema or tonsillar exudates, uvula midline, normal phonation, no trismus, tolerating secretions without difficulty. Eyes:     General:        Right eye: No discharge.        Left eye: No discharge.  Neck:     Musculoskeletal: Neck supple.  Cardiovascular:     Rate and Rhythm: Regular rhythm. Tachycardia present.     Pulses: Normal pulses.     Heart sounds: Normal heart sounds. No murmur. No friction rub. No gallop.   Pulmonary:     Effort: No respiratory distress.     Breath sounds: Wheezing present.     Comments: Patient is tachypneic with mildly increased respiratory effort, on exam patient sounds tight with scattered  expiratory wheezing noted throughout Abdominal:     General: Abdomen is flat. Bowel sounds are normal. There is no distension.     Palpations: Abdomen is soft. There is no mass.     Tenderness: There is no abdominal tenderness. There is no guarding.     Comments: Abdomen soft, nondistended, nontender to palpation in all quadrants without guarding or peritoneal signs  Musculoskeletal:     Right lower leg: No edema.     Left lower leg: No edema.     Comments: Lower extremities without edema or tenderness.  Skin:    General: Skin is warm and dry.     Capillary Refill: Capillary refill takes less than 2 seconds.  Neurological:     Mental Status: She is  alert and oriented to person, place, and time.     Coordination: Coordination normal.  Psychiatric:        Mood and Affect: Mood normal.        Behavior: Behavior normal.      ED Treatments / Results  Labs (all labs ordered are listed, but only abnormal results are displayed) Labs Reviewed  BASIC METABOLIC PANEL - Abnormal; Notable for the following components:      Result Value   CO2 20 (*)    Glucose, Bld 120 (*)    All other components within normal limits  CBC WITH DIFFERENTIAL/PLATELET - Abnormal; Notable for the following components:   Neutro Abs 8.3 (*)    Lymphs Abs 0.4 (*)    All other components within normal limits  INFLUENZA PANEL BY PCR (TYPE A & B)    EKG EKG Interpretation  Date/Time:  Friday July 23 2018 12:25:13 EST Ventricular Rate:  96 PR Interval:    QRS Duration: 108 QT Interval:  349 QTC Calculation: 441 R Axis:   66 Text Interpretation:  Sinus rhythm Borderline short PR interval Probable left atrial enlargement RSR' in V1 or V2, right VCD or RVH Confirmed by Kennis Carina [phone]) on 07/23/2018 2:47:27 PM   Radiology Dg Chest 2 View  Result Date: 07/23/2018 CLINICAL DATA:  Shortness of breath, cough EXAM: CHEST - 2 VIEW COMPARISON:  None. FINDINGS: The heart size and mediastinal contours are  within normal limits. Both lungs are clear. The visualized skeletal structures are unremarkable. IMPRESSION: No acute abnormality of the lungs.  No focal airspace opacity. Electronically Signed   By: Lauralyn Primes M.D.   On: 07/23/2018 13:37    Procedures Procedures (including critical care time)  Medications Ordered in ED Medications  albuterol (PROVENTIL,VENTOLIN) solution continuous neb (10 mg/hr Nebulization New Bag/Given 07/23/18 1451)  albuterol (PROVENTIL) (2.5 MG/3ML) 0.083% nebulizer solution 5 mg (5 mg Nebulization Given 07/23/18 1227)  ipratropium-albuterol (DUONEB) 0.5-2.5 (3) MG/3ML nebulizer solution 3 mL (3 mLs Nebulization Given 07/23/18 1309)  sodium chloride 0.9 % bolus 1,000 mL (0 mLs Intravenous Stopped 07/23/18 1426)     Initial Impression / Assessment and Plan / ED Course  I have reviewed the triage vital signs and the nursing notes.  Pertinent labs & imaging results that were available during my care of the patient were reviewed by me and considered in my medical decision making (see chart for details).  48 year old female with history of asthma, presents to the emergency department from urgent care for evaluation of shortness of breath and cough, noted to have O2 sats in the upper 80s at urgent care.  Given Solu-Medrol, ibuprofen and nebulizer treatment prior to arrival.  On my evaluation patient is tachycardic and mildly tachypneic, afebrile.  Lungs with scattered wheezing throughout and decreased air movement.  Presentation concerning for asthma exacerbation, patient has remote history of asthma but has not had issues with her asthma in many years.  She also has flulike symptoms with low-grade fever, chills, body aches, cough and congestion.  Patient does not have risk factors for DVT.  Patient is tachycardic on arrival but has already received nebulizer treatments which clouds this picture.  She is not on any OCPs, no recent long distance travel or hospitalization and no  personal history of PE or DVT.  We will get basic labs, influenza panel, EKG and chest x-ray.  We will continue to treat symptomatically with IV fluids and nebulizer treatments.  EKG shows sinus tachycardia with  no other concerning changes.  Chest x-ray is unremarkable with no evidence of airspace opacity or other acute cardiopulmonary disease.  Labs show no leukocytosis, normal hemoglobin, no acute electrolyte derangements requiring intervention and normal renal function.  Influenza PCR negative.  After first neb patient has had some improvement in lung sounds but still sounds a bit tight with some continued wheezing.  Will give 1 hour continuous nebulizer and then reevaluate and ambulate the patient.  After 1 hour of continuous albuterol patient ambulated in the department under my observation and continued to have episodes of hypoxia with tachypnea and increased respiratory effort.  Patient's O2 sats dropped as low was 88%.  Given her continued symptoms feel she may require admission but given that patient has not had asthma exacerbation in many years we will also obtain CT angio of the chest to rule out other etiology for patient's hypoxia.  Despite continued hypoxic episodes patient does report that she is beginning to feel better.  Case signed out to PA [facility] lawyer who will follow-up on CT of the chest and disposition appropriately, if patient continues to have episodes of hypoxia or increased respiratory effort regardless of CT results she will need to be admitted for further evaluation and treatment.  Final Clinical Impressions(s) / ED Diagnoses   Final diagnoses:  Exacerbation of asthma, unspecified asthma severity, unspecified whether persistent  Viral URI with cough    ED Discharge Orders    None       Legrand RamsFord, Kelsey N, PA-C 07/23/18 1628    Sabas SousBero, Michael M, MD 07/24/18 [phone]

## 2018-07-23 NOTE — ED Notes (Signed)
Bed: WTR6 Expected date:  Expected time:  Means of arrival:  Comments: 

## 2019-08-10 ENCOUNTER — Telehealth (HOSPITAL_COMMUNITY): Payer: Self-pay | Admitting: Licensed Clinical Social Worker

## 2021-01-30 DIAGNOSIS — J45909 Unspecified asthma, uncomplicated: Secondary | ICD-10-CM | POA: Insufficient documentation

## 2021-01-31 ENCOUNTER — Other Ambulatory Visit: Payer: Self-pay

## 2021-01-31 ENCOUNTER — Encounter (HOSPITAL_COMMUNITY): Payer: Self-pay | Admitting: Emergency Medicine

## 2021-01-31 ENCOUNTER — Emergency Department (HOSPITAL_COMMUNITY)
Admission: EM | Admit: 2021-01-31 | Discharge: 2021-02-01 | Disposition: A | Discharge status: Home / Self Care | Payer: BC Managed Care – PPO | Attending: Emergency Medicine | Admitting: Emergency Medicine

## 2021-01-31 DIAGNOSIS — N9489 Other specified conditions associated with female genital organs and menstrual cycle: Secondary | ICD-10-CM | POA: Insufficient documentation

## 2021-01-31 DIAGNOSIS — L03213 Periorbital cellulitis: Secondary | ICD-10-CM | POA: Diagnosis not present

## 2021-01-31 DIAGNOSIS — H5789 Other specified disorders of eye and adnexa: Secondary | ICD-10-CM | POA: Diagnosis present

## 2021-01-31 MED ORDER — KETOROLAC TROMETHAMINE 30 MG/ML IJ SOLN
30.0000 mg | Freq: Once | INTRAMUSCULAR | Status: AC
  Administered 2021-01-31: 30 mg via INTRAVENOUS
  Filled 2021-01-31: qty 1

## 2021-01-31 NOTE — ED Triage Notes (Signed)
Patient diagnosed conjunctivitis yesterday. Patient states that she was started on ointment that she has been using as directed. 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 Small amount of blood noted. No vision changes, no injury.

## 2021-01-31 NOTE — ED Notes (Signed)
Pt states that she has become dizzy and her head is hurting more now.

## 2021-02-01 ENCOUNTER — Emergency Department (HOSPITAL_COMMUNITY): Payer: BC Managed Care – PPO

## 2021-02-01 ENCOUNTER — Encounter (HOSPITAL_COMMUNITY): Payer: Self-pay

## 2021-02-01 ENCOUNTER — Telehealth (HOSPITAL_COMMUNITY): Payer: Self-pay | Admitting: Emergency Medicine

## 2021-02-01 LAB — BASIC METABOLIC PANEL
Anion gap: 4 — ABNORMAL LOW (ref 5–15)
BUN: 17 mg/dL (ref 6–20)
CO2: 24 mmol/L (ref 22–32)
Calcium: 9.1 mg/dL (ref 8.9–10.3)
Chloride: 111 mmol/L (ref 98–111)
Creatinine, Ser: 0.8 mg/dL (ref 0.44–1.00)
GFR, Estimated: >60 mL/min (ref >60)
Glucose, Bld: 110 mg/dL — ABNORMAL HIGH (ref 70–99)
Potassium: 3.4 mmol/L — ABNORMAL LOW (ref 3.5–5.1)
Sodium: 139 mmol/L (ref 135–145)

## 2021-02-01 LAB — CBC WITH DIFFERENTIAL/PLATELET
Abs Immature Granulocytes: 0.02 10*3/uL (ref 0.00–0.07)
Basophils Absolute: 0.1 10*3/uL (ref 0.0–0.1)
Basophils Relative: 1 %
Eosinophils Absolute: 0.7 10*3/uL — ABNORMAL HIGH (ref 0.0–0.5)
Eosinophils Relative: 9 %
HCT: 38.5 % (ref 36.0–46.0)
Hemoglobin: 13 g/dL (ref 12.0–15.0)
Immature Granulocytes: 0 %
Lymphocytes Relative: 21 %
Lymphs Abs: 1.6 10*3/uL (ref 0.7–4.0)
MCH: 33.6 pg (ref 26.0–34.0)
MCHC: 33.8 g/dL (ref 30.0–36.0)
MCV: 99.5 fL (ref 80.0–100.0)
Monocytes Absolute: 0.6 10*3/uL (ref 0.1–1.0)
Monocytes Relative: 8 %
Neutro Abs: 4.8 10*3/uL (ref 1.7–7.7)
Neutrophils Relative %: 61 %
Platelets: 255 10*3/uL (ref 150–400)
RBC: 3.87 MIL/uL (ref 3.87–5.11)
RDW: 12.7 % (ref 11.5–15.5)
WBC: 7.8 10*3/uL (ref 4.0–10.5)
nRBC: 0 % (ref 0.0–0.2)

## 2021-02-01 LAB — I-STAT BETA HCG BLOOD, ED (MC, WL, AP ONLY): I-stat hCG, quantitative: <5 m[IU]/mL (ref <5)

## 2021-02-01 MED ORDER — CLINDAMYCIN HCL 300 MG PO CAPS: 300.0000 mg | ORAL_CAPSULE | Freq: Three times a day (TID) | ORAL | 0 refills | Status: AC

## 2021-02-01 MED ORDER — CEFDINIR 300 MG PO CAPS: 300.0000 mg | ORAL_CAPSULE | Freq: Two times a day (BID) | ORAL | 0 refills | Status: AC

## 2021-02-01 MED ORDER — AMOXICILLIN-POT CLAVULANATE 875-125 MG PO TABS: 1.0000 | ORAL_TABLET | Freq: Two times a day (BID) | ORAL | 0 refills | Status: DC

## 2021-02-01 MED ORDER — IOHEXOL 350 MG/ML SOLN
60.0000 mL | Freq: Once | INTRAVENOUS | Status: AC | PRN
  Administered 2021-02-01: 60 mL via INTRAVENOUS

## 2021-02-01 MED ORDER — AMOXICILLIN-POT CLAVULANATE 875-125 MG PO TABS
1.0000 | ORAL_TABLET | Freq: Once | ORAL | Status: AC
  Administered 2021-02-01: 1 via ORAL
  Filled 2021-02-01: qty 1

## 2021-02-01 NOTE — Discharge Instructions (Addendum)
1. Medications: Augmentin, usual home medications 2. Treatment: rest, drink plenty of fluids, warm compresses 3. Follow Up: Please followup with your ophthalmologist later today; Please return to the ER if no improvement in the next 24 hours

## 2021-02-01 NOTE — Telephone Encounter (Signed)
Got a call from pharmacy as they do not have Augmentin.  Patient was prescribed cefdinir and clindamycin.

## 2021-02-01 NOTE — ED Notes (Signed)
Patient back from CT.

## 2021-02-01 NOTE — ED Provider Notes (Signed)
[facility] Provider Note   CSN: [id number] Arrival date & time: 01/31/21  2041     History Chief Complaint  Patient presents with   Conjunctivitis   Bleeding/Bruising    Jenna Allison is a 50 y.o. female presents to the emergency department with left eye swelling and drainage of bloody/purulent material.  Patient reports that 2 days ago she noticed a bit of swelling and redness of her eye.  She states that she saw a provider who diagnosed her with conjunctivitis and a stye and gave her erythromycin.  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  Since that time she has noticed blood and pus draining from the site of the stye in her left upper lid.  She reports over the last 24 hours she has noticed increased swelling of her eyelids and around her face.  She reports that she has also developed a headache in the last 12 hours.  No specific aggravating or alleviating factors.  Denies fever, chills, nausea, vomiting.  Patient reports that it is difficult to see out of the left eye because of the drainage but once this is cleared away her vision appears normal to her.  She does not wear contact lenses.  She does occasionally wear reading glasses.  The history is provided by the patient and medical records. No language interpreter was used.      Past Medical History:  Diagnosis Date   Anxiety    Depression     There are no problems to display for this patient.   History reviewed. No pertinent surgical history.   OB History   No obstetric history on file.     Family History  Problem Relation Age of Onset   Healthy Mother     Social History   Tobacco Use   Smoking status: Never   Smokeless tobacco: Never  Vaping Use   Vaping Use: Never used  Substance Use Topics   Alcohol use: Never   Drug use: Never    Home Medications Prior to Admission medications    Medication Sig Start Date End Date Taking? Authorizing Provider  amoxicillin-clavulanate (AUGMENTIN) 875-125 MG tablet Take 1 tablet by mouth 2 (two) times daily. One po bid x 7 days 02/01/21  Yes Meliza Kage, Dahlia Client, PA-C  albuterol (PROVENTIL HFA;VENTOLIN HFA) 108 (90 Base) MCG/ACT inhaler Inhale 2 puffs into the lungs every 4 (four) hours as needed for wheezing or shortness of breath. 07/23/18   Lawyer, Cristal Deer, PA-C  benztropine (COGENTIN) 0.5 MG tablet Take 0.5 mg by mouth daily.     [provider]  buPROPion (WELLBUTRIN XL) 150 MG 24 hr tablet Take 150 mg by mouth daily.  05/03/18   [provider]  cetirizine (ZYRTEC) 10 MG tablet Take 10 mg by mouth daily.    [provider]  citalopram (CELEXA) 40 MG tablet Take 40 mg by mouth daily.  05/03/18   [provider]  fluticasone (FLONASE) 50 MCG/ACT nasal spray Place 1 spray into both nostrils daily.    [provider]  Guaifenesin 1200 MG TB12 Take 1 tablet (1,200 mg total) by mouth 2 (two) times daily. 07/23/18   Lawyer, Cristal Deer, PA-C  LORazepam (ATIVAN) 0.5 MG tablet Take 0.5 mg by mouth 2 (two) times daily as needed for anxiety.  03/17/18   [provider]  Multiple Vitamins-Minerals (MULTIVITAMIN ADULT) TABS Take 1 tablet by mouth daily.  [provider]  predniSONE (DELTASONE) 50 MG tablet Take 1 tablet (50 mg total) by mouth daily. 07/23/18   Lawyer, Cristal Deer, PA-C  promethazine-dextromethorphan (PROMETHAZINE-DM) 6.25-15 MG/5ML syrup Take 5 mLs by mouth 4 (four) times daily as needed for cough. 07/23/18   Lawyer, Cristal Deer, PA-C  spironolactone (ALDACTONE) 50 MG tablet Take 50 mg by mouth daily. 06/25/18   [provider]  tazarotene (AVAGE) 0.1 % cream Apply 1 application topically 3 (three) times a week. 05/28/18   [provider]    Allergies    Patient has no known allergies.  Review of Systems   Review of Systems  Constitutional:   Negative for appetite change, diaphoresis, fatigue, fever and unexpected weight change.  HENT:  Negative for mouth sores.   Eyes:  Positive for pain, discharge and redness. Negative for visual disturbance.  Respiratory:  Negative for cough, chest tightness, shortness of breath and wheezing.   Cardiovascular:  Negative for chest pain.  Gastrointestinal:  Negative for abdominal pain, constipation, diarrhea, nausea and vomiting.  Endocrine: Negative for polydipsia, polyphagia and polyuria.  Genitourinary:  Negative for dysuria, frequency, hematuria and urgency.  Musculoskeletal:  Negative for back pain and neck stiffness.  Skin:  Negative for rash.  Allergic/Immunologic: Negative for immunocompromised state.  Neurological:  Negative for syncope, light-headedness and headaches.  Hematological:  Does not bruise/bleed easily.  Psychiatric/Behavioral:  Negative for sleep disturbance. The patient is not nervous/anxious.    Physical Exam Updated Vital Signs BP (!) 112/52   Pulse 70   Temp 98.2 F (36.8 C) (Oral)   Resp 14   SpO2 97%   Physical Exam Vitals and nursing note reviewed.  Constitutional:      General: She is not in acute distress.    Appearance: She is well-developed. She is not ill-appearing.  HENT:     Head: Normocephalic.  Eyes:     General: No scleral icterus.       Right eye: No discharge.        Left eye: Discharge present.    Extraocular Movements:     Right eye: Normal extraocular motion.     Left eye: Normal extraocular motion.     Conjunctiva/sclera:     Right eye: Right conjunctiva is not injected. No chemosis, exudate or hemorrhage.    Left eye: Left conjunctiva is injected. Chemosis present. No hemorrhage.     Comments: Mild tenderness to palpation along the soft tissues of the left orbit.  Left conjunctive a is irritated with injection and a bit of chemosis.  Cardiovascular:     Rate and Rhythm: Normal rate.  Pulmonary:     Effort: Pulmonary effort is  normal.  Abdominal:     General: There is no distension.  Musculoskeletal:        General: Normal range of motion.     Cervical back: Normal range of motion.  Lymphadenopathy:     Cervical: No cervical adenopathy.  Skin:    General: Skin is warm and dry.  Neurological:     Mental Status: She is alert.  Psychiatric:        Mood and Affect: Mood normal.    ED Results / Procedures / Treatments   Labs (all labs ordered are listed, but only abnormal results are displayed) Labs Reviewed  CBC WITH DIFFERENTIAL/PLATELET - Abnormal; Notable for the following components:      Result Value   Eosinophils Absolute 0.7 (*)    All other components within normal limits  BASIC METABOLIC PANEL - Abnormal; Notable for the following components:   Potassium 3.4 (*)    Glucose, Bld 110 (*)    Anion gap 4 (*)    All other components within normal limits  I-STAT BETA HCG BLOOD, ED ([facility], [facility], [facility] ONLY)     Radiology CT Orbits W Contrast  Result Date: 02/01/2021 CLINICAL DATA:  Conjunctivitis with ocular bleeding EXAM: CT ORBITS WITH CONTRAST TECHNIQUE: Multidetector CT images was performed according to the standard protocol following intravenous contrast administration. CONTRAST:  68mL OMNIPAQUE IOHEXOL 350 MG/ML SOLN COMPARISON:  No pertinent prior exam. FINDINGS: Orbits: --Globes: Normal. --Bony orbit: Normal. --Preseptal soft tissues: Mild left periorbital soft tissue edema. --Intra- and extraconal orbital fat: Normal. No inflammatory stranding. --Optic nerves: Normal. --Lacrimal glands and fossae: Normal. --Extraocular muscles: Normal. Visualized sinuses:  No fluid levels or advanced mucosal thickening. Soft tissues: Normal. Limited intracranial: Normal. IMPRESSION: Mild left periorbital soft tissue edema, compatible with periorbital cellulitis. No orbital cellulitis or abscess. Electronically Signed   By: Deatra Robinson M.D.   On: 02/01/2021 01:29    Procedures Procedures   Medications Ordered in  ED Medications  ketorolac (TORADOL) 30 MG/ML injection 30 mg (30 mg Intravenous Given 01/31/21 2358)  iohexol (OMNIPAQUE) 350 MG/ML injection 60 mL (60 mLs Intravenous Contrast Given 02/01/21 0043)  amoxicillin-clavulanate (AUGMENTIN) 875-125 MG per tablet 1 tablet (1 tablet Oral Given 02/01/21 0203)    ED Course  I have reviewed the triage vital signs and the nursing notes.  Pertinent labs & imaging results that were available during my care of the patient were reviewed by me and considered in my medical decision making (see chart for details).    MDM Rules/Calculators/A&P                           Patient presents with swelling of the periorbital tissues of the left eye.  Location of the stye now with drainage of bloody and purulent fluid.  No palpable abscess.  Actively draining and I do not believe that I&D of the area would be beneficial for the patient.  Given the tenderness to the periorbital tissue and her headache will obtain imaging for further evaluation of possible orbital cellulitis.  2:01 AM CT scan shows periorbital cellulitis but no orbital cellulitis or abscess.  Patient with no known trauma to the eye.  Will give Augmentin.  Patient instructed to return to the emergency department in 24 hours if no improvement on Augmentin.  We will have her follow with ophthalmology as well.  Patient states understanding and is in agreement with the plan.   Final Clinical Impression(s) / ED Diagnoses Final diagnoses:  Periorbital cellulitis of left eye    Rx / DC Orders ED Discharge Orders          Ordered    amoxicillin-clavulanate (AUGMENTIN) 875-125 MG tablet  2 times daily        02/01/21 0215             Shanica Castellanos, Boyd Kerbs 02/01/21 0216    Sabas Sous, MD 02/01/21 [phone]

## 2021-07-19 ENCOUNTER — Ambulatory Visit (HOSPITAL_COMMUNITY)
Admission: EM | Admit: 2021-07-19 | Discharge: 2021-07-19 | Disposition: A | Discharge status: Home / Self Care | Payer: BC Managed Care – PPO | Attending: Family Medicine | Admitting: Family Medicine

## 2021-07-19 ENCOUNTER — Encounter (HOSPITAL_COMMUNITY): Payer: Self-pay | Admitting: *Deleted

## 2021-07-19 DIAGNOSIS — J4521 Mild intermittent asthma with (acute) exacerbation: Secondary | ICD-10-CM | POA: Diagnosis not present

## 2021-07-19 HISTORY — DX: Unspecified asthma, uncomplicated: J45.909

## 2021-07-19 HISTORY — DX: Migraine, unspecified, not intractable, without status migrainosus: G43.909

## 2021-07-19 HISTORY — DX: Acne, unspecified: L70.9

## 2021-07-19 MED ORDER — MONTELUKAST SODIUM 10 MG PO TABS: 10.0000 mg | ORAL_TABLET | Freq: Every day | ORAL | 0 refills | Status: DC

## 2021-07-19 MED ORDER — PREDNISONE 20 MG PO TABS: 40.0000 mg | ORAL_TABLET | Freq: Every day | ORAL | 0 refills | Status: AC

## 2021-07-19 MED ORDER — ALBUTEROL SULFATE (2.5 MG/3ML) 0.083% IN NEBU
INHALATION_SOLUTION | RESPIRATORY_TRACT | Status: AC
  Filled 2021-07-19: qty 3

## 2021-07-19 MED ORDER — ALBUTEROL SULFATE (2.5 MG/3ML) 0.083% IN NEBU
2.5000 mg | INHALATION_SOLUTION | Freq: Once | RESPIRATORY_TRACT | Status: AC
  Administered 2021-07-19: 2.5 mg via RESPIRATORY_TRACT

## 2021-07-19 NOTE — ED Provider Notes (Addendum)
[name]    CSN: [id number] Arrival date & time: 07/19/21  1740      History   Chief Complaint Chief Complaint  Patient presents with   Wheezing    HPI Jenna Allison is a 51 y.o. female.    Wheezing Here for a 1 week history of dry cough.  It has become more persistent and she has begun wheezing more in the last few days.  No fever or chills.  She has had a little sore throat and some hoarseness.  Past Medical History:  Diagnosis Date   Acne    Anxiety    Asthma    Depression    Migraine     There are no problems to display for this patient.   History reviewed. No pertinent surgical history.  OB History   No obstetric history on file.      Home Medications    Prior to Admission medications   Medication Sig Start Date End Date Taking? Authorizing Provider  albuterol (PROVENTIL HFA;VENTOLIN HFA) 108 (90 Base) MCG/ACT inhaler Inhale 2 puffs into the lungs every 4 (four) hours as needed for wheezing or shortness of breath. 07/23/18  Yes Lawyer, Harrell Gave, PA-C  botulinum toxin Type A 100 Units in sodium chloride (PF) 0.9 % 4 mL 100 Units by Submucosal route once.   Yes [provider]  buPROPion (WELLBUTRIN XL) 150 MG 24 hr tablet Take 150 mg by mouth daily.  05/03/18  Yes [provider]  citalopram (CELEXA) 40 MG tablet Take 40 mg by mouth daily.  05/03/18  Yes [provider]  montelukast (SINGULAIR) 10 MG tablet Take 1 tablet (10 mg total) by mouth at bedtime. 07/19/21  Yes Barrett Henle, MD  Multiple Vitamins-Minerals (MULTIVITAMIN ADULT) TABS Take 1 tablet by mouth daily.   Yes [provider]  predniSONE (DELTASONE) 20 MG tablet Take 2 tablets (40 mg total) by mouth daily with breakfast for 5 days. 07/19/21 07/24/21 Yes Barrett Henle, MD  spironolactone (ALDACTONE) 50 MG tablet Take 50 mg by mouth daily. 06/25/18  Yes [provider]  Topiramate (TOPAMAX PO) Take by mouth.   Yes [provider]  benztropine (COGENTIN) 0.5 MG tablet Take 0.5 mg by mouth daily.     [provider]  cetirizine (ZYRTEC) 10 MG tablet Take 10 mg by mouth daily.    [provider]  fluticasone (FLONASE) 50 MCG/ACT nasal spray Place 1 spray into both nostrils daily.    [provider]  Guaifenesin 1200 MG TB12 Take 1 tablet (1,200 mg total) by mouth 2 (two) times daily. 07/23/18   Lawyer, Harrell Gave, PA-C  LORazepam (ATIVAN) 0.5 MG tablet Take 0.5 mg by mouth 2 (two) times daily as needed for anxiety.  03/17/18   [provider]  tazarotene (AVAGE) 0.1 % cream Apply 1 application topically 3 (three) times a week. 05/28/18   [provider]    Family History Family History  Problem Relation Age of Onset   Healthy Mother     Social History Social History   Tobacco Use   Smoking status: Never   Smokeless tobacco: Never  Vaping Use   Vaping Use: Never used  Substance Use Topics   Alcohol use: Never   Drug use: Never     Allergies   Patient has no known allergies.   Review of Systems Review of Systems  Respiratory:  Positive for wheezing.     Physical Exam Triage Vital Signs  ED Triage Vitals  Enc Vitals Group     BP 07/19/21 1818 123/66     Pulse Rate 07/19/21 1818 70     Resp 07/19/21 1818 20     Temp 07/19/21 1818 98.2 F (36.8 C)     Temp Source 07/19/21 1818 Oral     SpO2 07/19/21 1818 96 %     Weight --      Height --      Head Circumference --      Peak Flow --      Pain Score 07/19/21 1819 7     Pain Loc --      Pain Edu? --      Excl. in [place]? --    No data found.  Updated Vital Signs BP 123/66    Pulse 70    Temp 98.2 F (36.8 C) (Oral)    Resp 20    SpO2 96%   Visual Acuity Right Eye Distance:   Left Eye Distance:   Bilateral Distance:    Right Eye Near:   Left Eye Near:    Bilateral Near:     Physical Exam Vitals reviewed.  Constitutional:      General: She is not in acute distress.     Appearance: She is not toxic-appearing.     Comments: Is having a staccato cough here in the room  HENT:     Right Ear: Tympanic membrane and ear canal normal.     Left Ear: Tympanic membrane and ear canal normal.     Nose: Nose normal.     Mouth/Throat:     Mouth: Mucous membranes are moist.     Pharynx: Posterior oropharyngeal erythema (Erythema of the tonsillar pillars is present) present. No oropharyngeal exudate.  Eyes:     Extraocular Movements: Extraocular movements intact.     Conjunctiva/sclera: Conjunctivae normal.     Pupils: Pupils are equal, round, and reactive to light.  Cardiovascular:     Rate and Rhythm: Normal rate and regular rhythm.     Heart sounds: No murmur heard. Pulmonary:     Effort: Pulmonary effort is normal. No respiratory distress.     Breath sounds: No wheezing, rhonchi or rales.  Musculoskeletal:     Cervical back: Neck supple.  Lymphadenopathy:     Cervical: No cervical adenopathy.  Skin:    Capillary Refill: Capillary refill takes less than 2 seconds.     Coloration: Skin is not jaundiced or pale.  Neurological:     General: No focal deficit present.     Mental Status: She is alert and oriented to person, place, and time.  Psychiatric:        Behavior: Behavior normal.     UC Treatments / Results  Labs (all labs ordered are listed, but only abnormal results are displayed) Labs Reviewed - No data to display  EKG   Radiology No results found.  Procedures Procedures (including critical care time)  Medications Ordered in UC Medications  albuterol (PROVENTIL) (2.5 MG/3ML) 0.083% nebulizer solution 2.5 mg (2.5 mg Nebulization Given 07/19/21 1900)    Initial Impression / Assessment and Plan / UC Course  I have reviewed the triage vital signs and the nursing notes.  Pertinent labs & imaging results that were available during my care of the patient were reviewed by me and considered in my medical decision making (see chart for  details).     Neb tx given here to help her till she can get  home. Symptoms improved  Will treat for asthma exacerbation and for allergies. She has adequate doses on her rescue inhaler. Final Clinical Impressions(s) / UC Diagnoses   Final diagnoses:  Mild intermittent asthma with exacerbation     Discharge Instructions      You were given nebulization of albuterol tonight.  Take prednisone 2 tabs daily for 5 days  Begin taking montelukast 10 mg, 1 every evening  Continue using your albuterol 2 puffs every 4 hours as needed for shortness of breath or wheezing     ED Prescriptions     Medication Sig Dispense Auth. Provider   predniSONE (DELTASONE) 20 MG tablet Take 2 tablets (40 mg total) by mouth daily with breakfast for 5 days. 10 tablet Barrett Henle, MD   montelukast (SINGULAIR) 10 MG tablet Take 1 tablet (10 mg total) by mouth at bedtime. 30 tablet Jamair Cato, Gwenlyn Perking, MD      PDMP not reviewed this encounter.   Barrett Henle, MD 07/19/21 Remonia Richter    Barrett Henle, MD 07/19/21 [phone]

## 2021-07-19 NOTE — ED Triage Notes (Signed)
C/O dry cough onset 1 wk ago; has been having wheezing; feels it was triggered by being outside. Denies fevers.  Has albuterol HFA throughout the week.  Wheezing noted with coughing.

## 2021-07-19 NOTE — Discharge Instructions (Addendum)
You were given nebulization of albuterol tonight.  Take prednisone 2 tabs daily for 5 days  Begin taking montelukast 10 mg, 1 every evening  Continue using your albuterol 2 puffs every 4 hours as needed for shortness of breath or wheezing

## 2021-07-26 ENCOUNTER — Emergency Department (INDEPENDENT_AMBULATORY_CARE_PROVIDER_SITE_OTHER)
Admission: EM | Admit: 2021-07-26 | Discharge: 2021-07-26 | Disposition: A | Discharge status: Home / Self Care | Payer: BC Managed Care – PPO | Source: Home / Self Care

## 2021-07-26 ENCOUNTER — Other Ambulatory Visit: Payer: Self-pay

## 2021-07-26 ENCOUNTER — Emergency Department (INDEPENDENT_AMBULATORY_CARE_PROVIDER_SITE_OTHER): Payer: BC Managed Care – PPO

## 2021-07-26 DIAGNOSIS — R053 Chronic cough: Secondary | ICD-10-CM | POA: Diagnosis not present

## 2021-07-26 DIAGNOSIS — R059 Cough, unspecified: Secondary | ICD-10-CM

## 2021-07-26 DIAGNOSIS — J309 Allergic rhinitis, unspecified: Secondary | ICD-10-CM | POA: Diagnosis not present

## 2021-07-26 MED ORDER — FEXOFENADINE HCL 180 MG PO TABS: 180.0000 mg | ORAL_TABLET | Freq: Every day | ORAL | 0 refills | Status: DC

## 2021-07-26 MED ORDER — PROMETHAZINE-DM 6.25-15 MG/5ML PO SYRP: 5.0000 mL | ORAL_SOLUTION | Freq: Two times a day (BID) | ORAL | 0 refills | Status: DC | PRN

## 2021-07-26 MED ORDER — PREDNISONE 20 MG PO TABS: ORAL_TABLET | ORAL | 0 refills | Status: DC

## 2021-07-26 MED ORDER — AZITHROMYCIN 250 MG PO TABS: 250.0000 mg | ORAL_TABLET | Freq: Every day | ORAL | 0 refills | Status: DC

## 2021-07-26 NOTE — ED Provider Notes (Signed)
[facility]    CSN: [id number] Arrival date & time: 07/26/21  1738      History   Chief Complaint Chief Complaint  Patient presents with   Cough    HPI Jenna Allison is a 51 y.o. female.   HPI 51 year old female presents with cough for 2 weeks.  Patient reports was evaluated for similar symptoms 5 weeks ago.  PMH significant for asthma and migraine.  Patient was recently evaluated for mild intermittent asthma exacerbation on 07/19/2021.  Past Medical History:  Diagnosis Date   Acne    Anxiety    Asthma    Depression    Migraine     There are no problems to display for this patient.   History reviewed. No pertinent surgical history.  OB History   No obstetric history on file.      Home Medications    Prior to Admission medications   Medication Sig Start Date End Date Taking? Authorizing Provider  azithromycin (ZITHROMAX) 250 MG tablet Take 1 tablet (250 mg total) by mouth daily. Take first 2 tablets together, then 1 every day until finished. 07/26/21  Yes Trevor Iha, FNP  fexofenadine [facility] ALLERGY) 180 MG tablet Take 1 tablet (180 mg total) by mouth daily for 15 days. 07/26/21 08/10/21 Yes Trevor Iha, FNP  predniSONE (DELTASONE) 20 MG tablet Take 3 tabs PO daily x 5 days. 07/26/21  Yes Trevor Iha, FNP  promethazine-dextromethorphan (PROMETHAZINE-DM) 6.25-15 MG/5ML syrup Take 5 mLs by mouth 2 (two) times daily as needed for cough. 07/26/21  Yes Trevor Iha, FNP  albuterol (PROVENTIL HFA;VENTOLIN HFA) 108 (90 Base) MCG/ACT inhaler Inhale 2 puffs into the lungs every 4 (four) hours as needed for wheezing or shortness of breath. 07/23/18   Lawyer, Cristal Deer, PA-C  benztropine (COGENTIN) 0.5 MG tablet Take 0.5 mg by mouth daily.     [provider]  botulinum toxin Type A 100 Units in sodium chloride (PF) 0.9 % 4 mL 100 Units by Submucosal route once.    [provider]  buPROPion (WELLBUTRIN XL) 150 MG 24 hr tablet Take 150 mg  by mouth daily.  05/03/18   [provider]  cetirizine (ZYRTEC) 10 MG tablet Take 10 mg by mouth daily.    [provider]  citalopram (CELEXA) 40 MG tablet Take 40 mg by mouth daily.  05/03/18   [provider]  fluticasone (FLONASE) 50 MCG/ACT nasal spray Place 1 spray into both nostrils daily.    [provider]  Guaifenesin 1200 MG TB12 Take 1 tablet (1,200 mg total) by mouth 2 (two) times daily. 07/23/18   Lawyer, Cristal Deer, PA-C  LORazepam (ATIVAN) 0.5 MG tablet Take 0.5 mg by mouth 2 (two) times daily as needed for anxiety.  03/17/18   [provider]  montelukast (SINGULAIR) 10 MG tablet Take 1 tablet (10 mg total) by mouth at bedtime. 07/19/21   Zenia Resides, MD  Multiple Vitamins-Minerals (MULTIVITAMIN ADULT) TABS Take 1 tablet by mouth daily.    [provider]  spironolactone (ALDACTONE) 50 MG tablet Take 50 mg by mouth daily. 06/25/18   [provider]  tazarotene (AVAGE) 0.1 % cream Apply 1 application topically 3 (three) times a week. 05/28/18   [provider]  Topiramate (TOPAMAX PO) Take by mouth.    [provider]    Family History Family History  Problem Relation Age of Onset   Healthy Mother     Social History Social History  Tobacco Use   Smoking status: Never   Smokeless tobacco: Never  Vaping Use   Vaping Use: Never used  Substance Use Topics   Alcohol use: Never   Drug use: Never     Allergies   Patient has no known allergies.   Review of Systems Review of Systems  Respiratory:  Positive for cough.   All other systems reviewed and are negative.   Physical Exam Triage Vital Signs ED Triage Vitals  Enc Vitals Group     BP 07/26/21 1755 118/80     Pulse Rate 07/26/21 1755 86     Resp 07/26/21 1755 16     Temp 07/26/21 1755 98.9 F (37.2 C)     Temp Source 07/26/21 1755 Oral     SpO2 07/26/21 1755 99 %     Weight --      Height --      Head Circumference  --      Peak Flow --      Pain Score 07/26/21 1756 0     Pain Loc --      Pain Edu? --      Excl. in [place]? --    No data found.  Updated Vital Signs BP 118/80 (BP Location: Right Arm)    Pulse 86    Temp 98.9 F (37.2 C) (Oral)    Resp 16    SpO2 99%      Physical Exam Vitals and nursing note reviewed.  Constitutional:      General: She is not in acute distress.    Appearance: Normal appearance. She is normal weight. She is not ill-appearing.  HENT:     Head: Normocephalic and atraumatic.     Right Ear: Tympanic membrane, ear canal and external ear normal.     Left Ear: Tympanic membrane, ear canal and external ear normal.     Mouth/Throat:     Mouth: Mucous membranes are moist.     Pharynx: Oropharynx is clear.     Comments: Moderate to significant amount of clear drainage of posterior oropharynx noted Eyes:     Extraocular Movements: Extraocular movements intact.     Conjunctiva/sclera: Conjunctivae normal.     Pupils: Pupils are equal, round, and reactive to light.  Cardiovascular:     Rate and Rhythm: Normal rate and regular rhythm.     Pulses: Normal pulses.     Heart sounds: Normal heart sounds.  Pulmonary:     Effort: Pulmonary effort is normal.     Breath sounds: Normal breath sounds. No wheezing, rhonchi or rales.     Comments: Frequent nonproductive cough noted on exam Musculoskeletal:     Cervical back: Normal range of motion and neck supple.  Skin:    General: Skin is warm and dry.  Neurological:     General: No focal deficit present.     Mental Status: She is alert and oriented to person, place, and time.     UC Treatments / Results  Labs (all labs ordered are listed, but only abnormal results are displayed) Labs Reviewed - No data to display  EKG   Radiology DG Chest 2 View  Result Date: 07/26/2021 CLINICAL DATA:  Cough several weeks. EXAM: CHEST - 2 VIEW COMPARISON:  07/23/2018 FINDINGS: The heart size and mediastinal contours are within  normal limits. Both lungs are clear. Old left clavicle fracture. IMPRESSION: No active cardiopulmonary disease. Electronically Signed   By: Elberta Fortis M.D.   On: 07/26/2021 18:30  Procedures Procedures (including critical care time)  Medications Ordered in UC Medications - No data to display  Initial Impression / Assessment and Plan / UC Course  I have reviewed the triage vital signs and the nursing notes.  Pertinent labs & imaging results that were available during my care of the patient were reviewed by me and considered in my medical decision making (see chart for details).     MDM: 1. Cough-CXR revealed no active cardiopulmonary disease.  Rx'd Zithromax, prednisone, and promethazine DM; 2.  Allergic rhinitis-Rx'd Allegra. Advised/inform patient of chest x-ray results tonight.  Advised patient may use Promethazine DM tonight for cough.  Advised patient to use this medication prior to sleep due to sedate of effects.  Instructed patient to start Zithromax tomorrow morning, Saturday, 07/27/2021.  Advised patient to take prednisone and Allegra with Zithromax for the next 5 days.  Encouraged patient to increase daily water intake while taking these medications.  Advised patient if cough worsens and/or unresolved please follow-up with PCP for further evaluation.  Patient discharged home, hemodynamically stable. Final Clinical Impressions(s) / UC Diagnoses   Final diagnoses:  Cough, unspecified type  Allergic rhinitis, unspecified seasonality, unspecified trigger     Discharge Instructions      Advised/inform patient of chest x-ray results tonight.  Advised patient may use Promethazine DM tonight for cough.  Advised patient to use this medication prior to sleep due to sedate of effects.  Instructed patient to start Zithromax tomorrow morning, Saturday, 07/27/2021.  Advised patient to take prednisone and Allegra with Zithromax for the next 5 days.  Encouraged patient to increase daily water  intake while taking these medications.  Advised patient if cough worsens and/or unresolved please follow-up with PCP for further evaluation.     ED Prescriptions     Medication Sig Dispense Auth. Provider   promethazine-dextromethorphan (PROMETHAZINE-DM) 6.25-15 MG/5ML syrup Take 5 mLs by mouth 2 (two) times daily as needed for cough. 118 mL Trevor Iha, FNP   azithromycin (ZITHROMAX) 250 MG tablet Take 1 tablet (250 mg total) by mouth daily. Take first 2 tablets together, then 1 every day until finished. 6 tablet Trevor Iha, FNP   predniSONE (DELTASONE) 20 MG tablet Take 3 tabs PO daily x 5 days. 15 tablet Trevor Iha, FNP   fexofenadine [facility] ALLERGY) 180 MG tablet Take 1 tablet (180 mg total) by mouth daily for 15 days. 15 tablet Trevor Iha, FNP      PDMP not reviewed this encounter.   Trevor Iha, FNP 07/26/21 [phone]

## 2021-07-26 NOTE — ED Triage Notes (Signed)
Pt presents with cough x2 weeks. Pt states she was seen for similar sx 5 weeks ago for similar sx.

## 2021-07-26 NOTE — Discharge Instructions (Addendum)
Advised/inform patient of chest x-ray results tonight.  Advised patient may use Promethazine DM tonight for cough.  Advised patient to use this medication prior to sleep due to sedate of effects.  Instructed patient to start Zithromax tomorrow morning, Saturday, 07/27/2021.  Advised patient to take prednisone and Allegra with Zithromax for the next 5 days.  Encouraged patient to increase daily water intake while taking these medications.  Advised patient if cough worsens and/or unresolved please follow-up with PCP for further evaluation.

## 2021-11-18 IMAGING — CT CT ORBITS W/ CM
3 series · 15 of 47 positions shown, 18 images · IV contrast (OMNIPAQUE 350)
Comparison: No pertinent prior exam.

CLINICAL DATA: Conjunctivitis with ocular bleeding

EXAM:
CT ORBITS WITH CONTRAST
TECHNIQUE: Multidetector CT images was performed according to the standard
protocol following intravenous contrast administration.
CONTRAST:  60mL OMNIPAQUE IOHEXOL 350 MG/ML SOLN

[Series 3: orbits 2.0 st · axial · 0.33mm/px · z∈[+1291,+1373]mm · 9 of 49 slices shown, 12 images]
[im 4/49  brain]
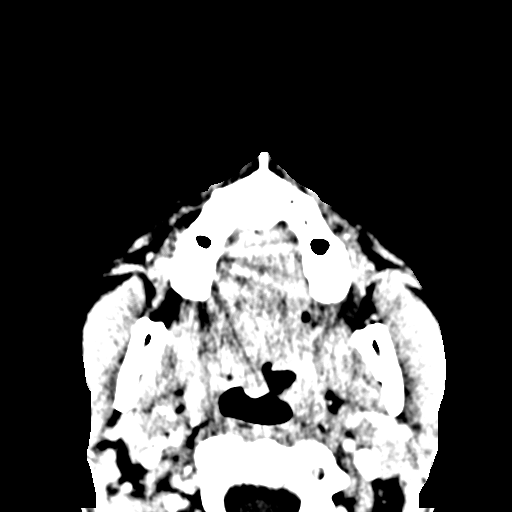
[im 4/49  bone]
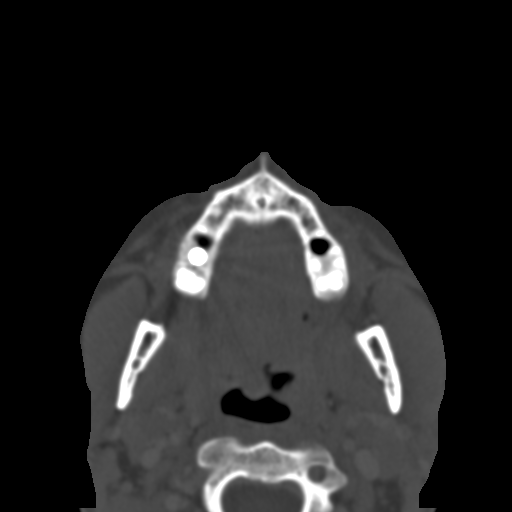
[im 9/49  bone]
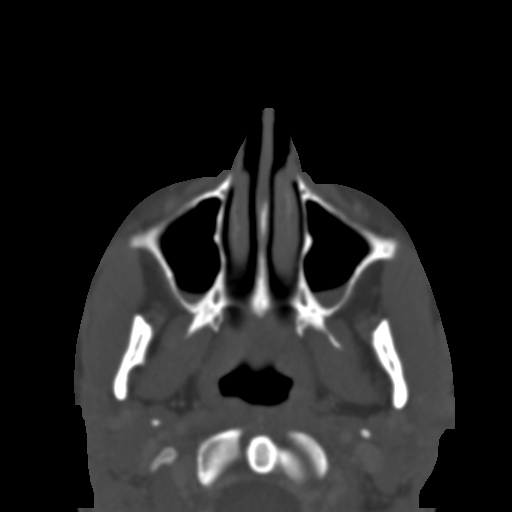
[im 14/49  bone]
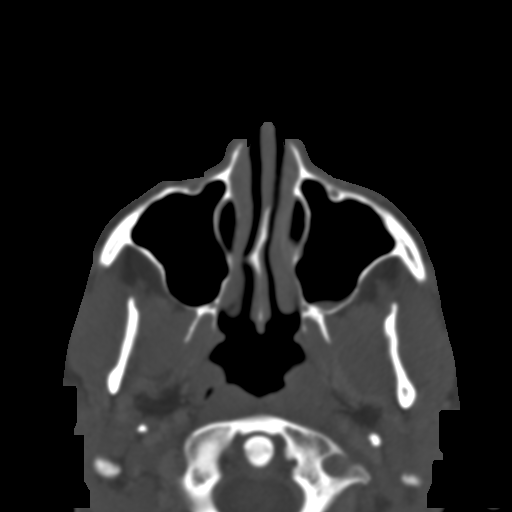
[im 19/49  bone]
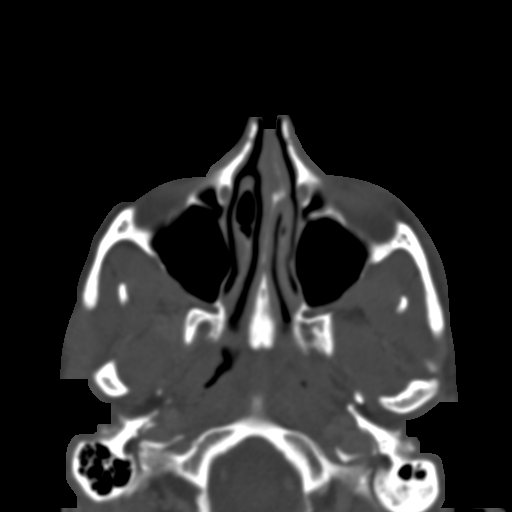
[im 25/49  brain]
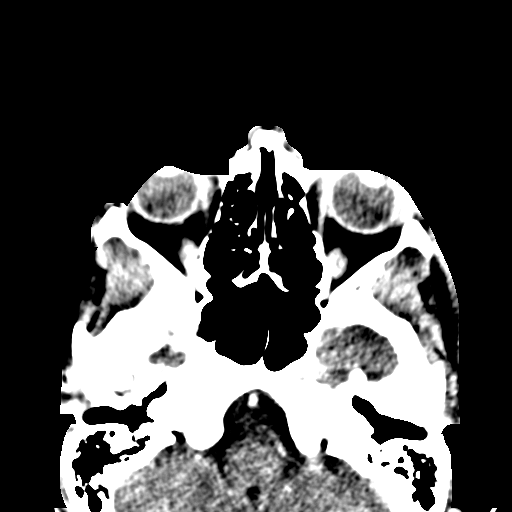
[im 25/49  bone]
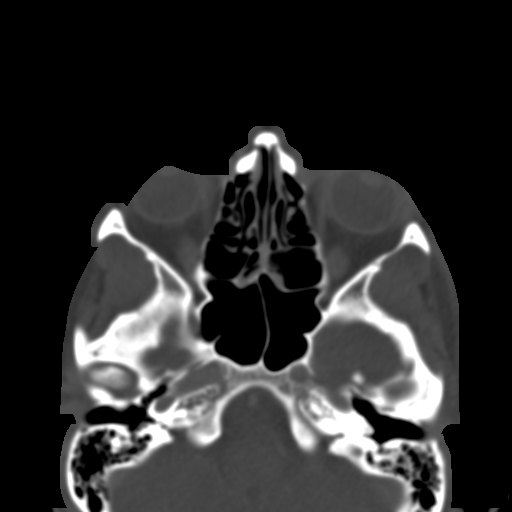
[im 30/49  bone]
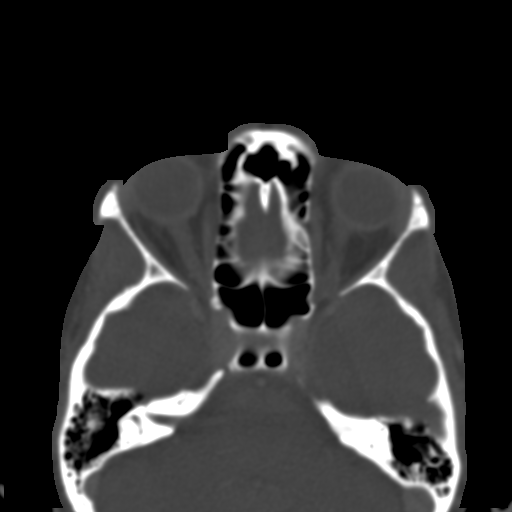
[im 35/49  bone]
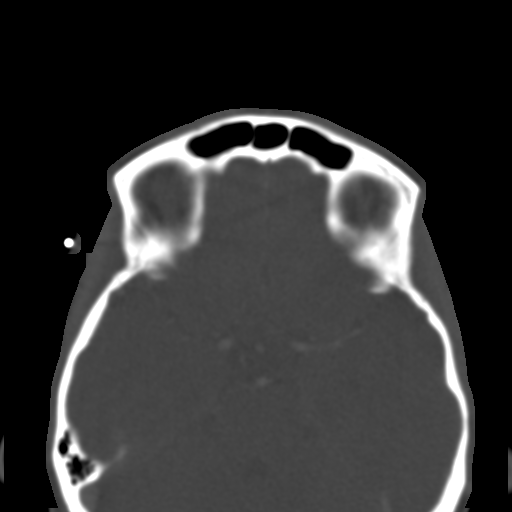
[im 40/49  bone]
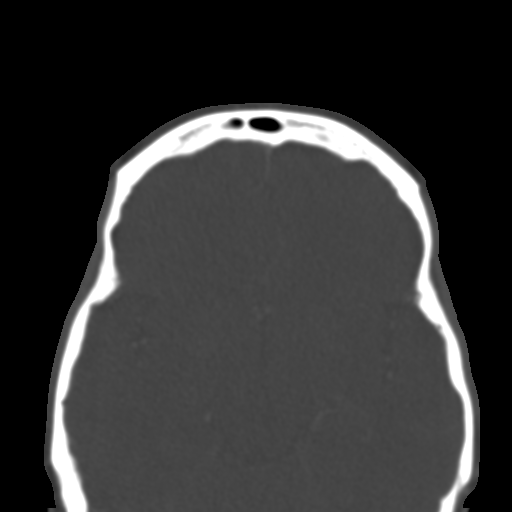
[im 45/49  brain]
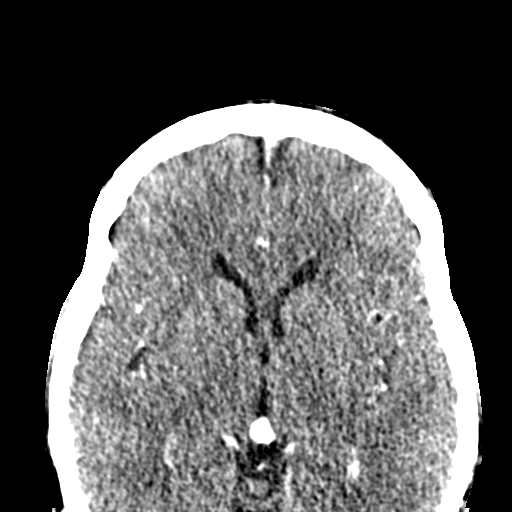
[im 45/49  bone]
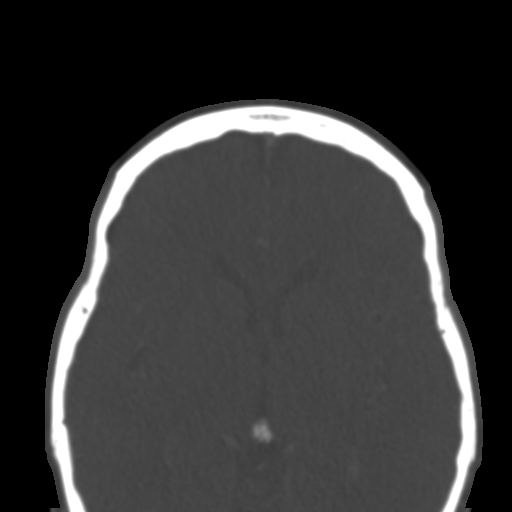

[Series 5: orbits coronal st · coronal · 0.20mm/px · 3 of 71 slices shown]
[im 24/71  bone]
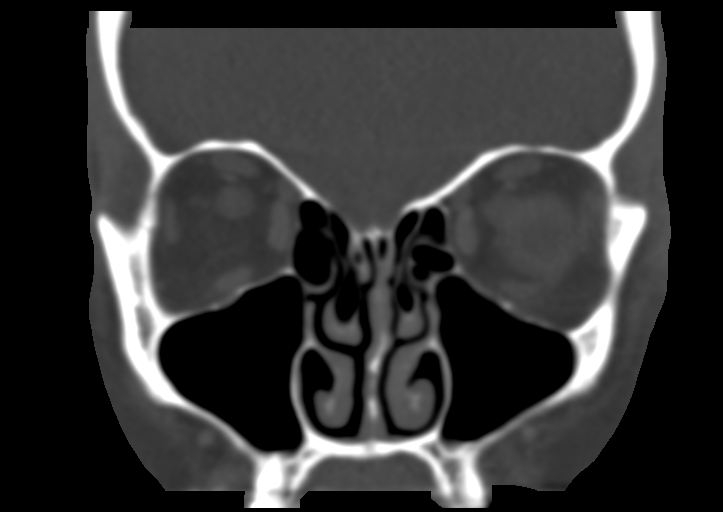
[im 32/71  bone]
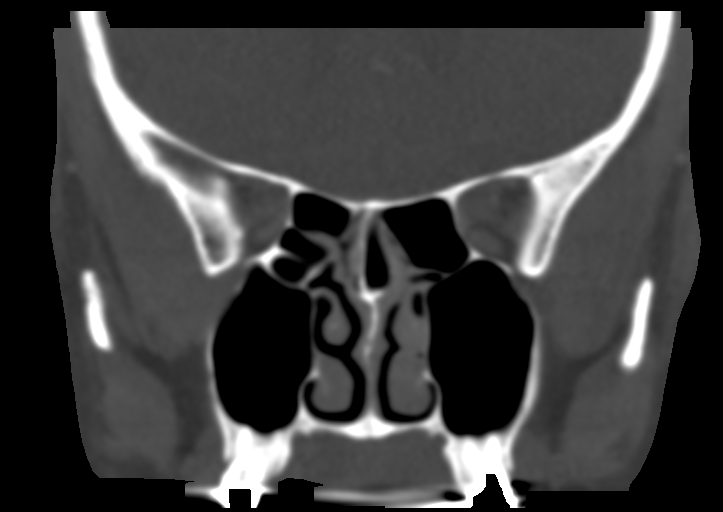
[im 39/71  bone]
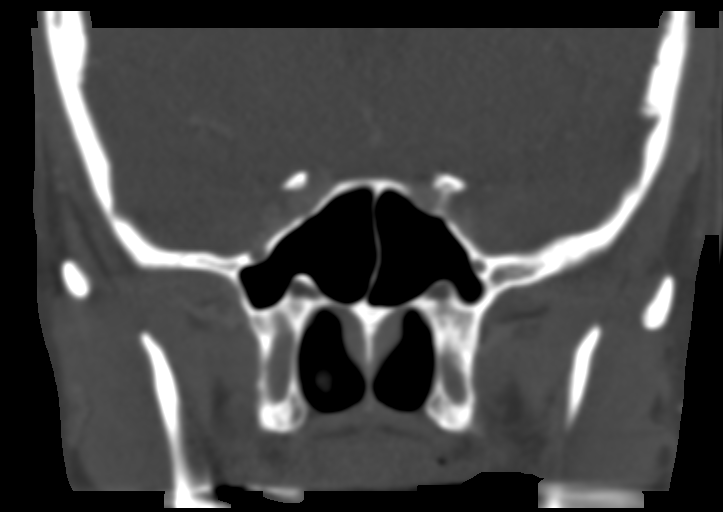

[Series 6: orbits sagittal st · sagittal · 0.21mm/px · 3 of 73 slices shown]
[im 25/73  bone]
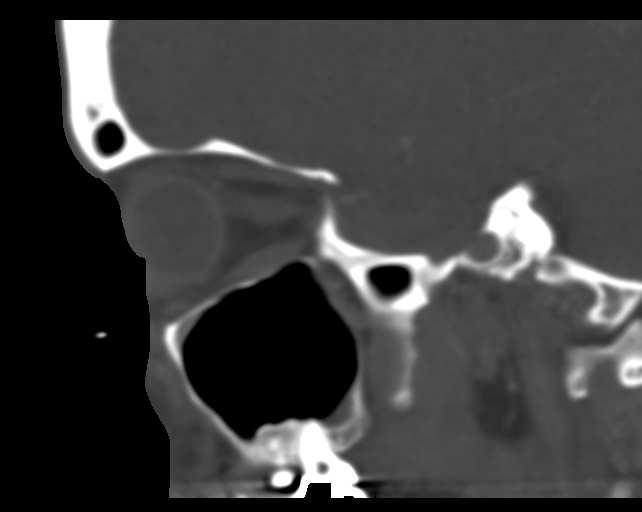
[im 37/73  bone]
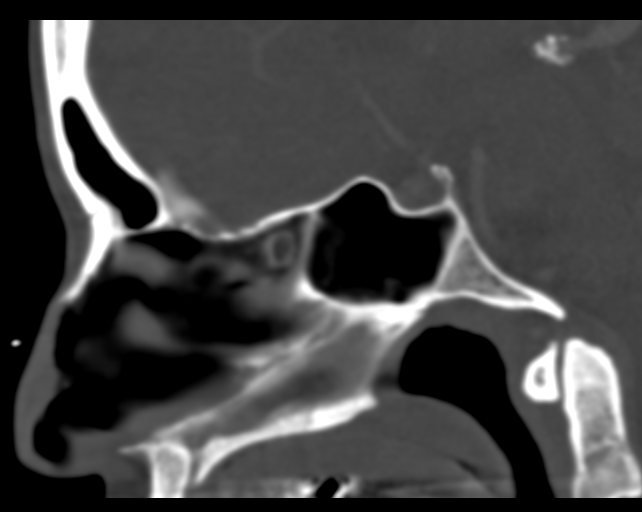
[im 49/73  bone]
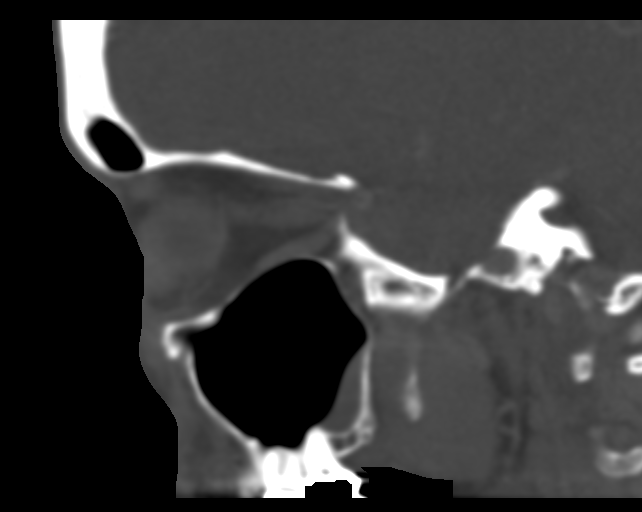

[15 of 47 positions shown; findings below may reference images not displayed]

FINDINGS: Orbits:

--Globes: Normal.

--Bony orbit: Normal.

--Preseptal soft tissues: Mild left periorbital soft tissue edema.

--Intra- and extraconal orbital fat: Normal. No inflammatory
stranding.

--Optic nerves: Normal.

--Lacrimal glands and fossae: Normal.

--Extraocular muscles: Normal.

Visualized sinuses:  No fluid levels or advanced mucosal thickening.

Soft tissues: Normal.

Limited intracranial: Normal.
IMPRESSION: Mild left periorbital soft tissue edema, compatible with periorbital
cellulitis. No orbital cellulitis or abscess.

## 2021-11-25 NOTE — Progress Notes (Signed)
Synopsis: Referred for asthma by Ceasar Lund, PA  Subjective:   PATIENT ID: Jenna Allison GENDER: female DOB: [birth year], MRN: [id number]  Chief Complaint  Patient presents with   Pulmonary Consult    Referred by Sanda Klein, PA. Pt states dxed with Asthma years ago. She states she tends to get bronchitis often. Has never been seen by pulm. Her symptoms usually flare in the Spring and also when she weather is humid. Today she has minimal cough, wheezing, and SOB.    51yF with history of asthma (dx 20 ya), depression, migraine referred for asthma  Seen in UC for bronchitis 2/24, 2/17. Usually has 1-2 exacerbations/bouts of bronchitis per year. Had covid-19 twice. First infection was very early in pandemic and was severe - seen in ED but wasn't admitted.   Was started on flovent - she is taking it daily now. Helps some. Humidity/heat remain a challenge for her. Usually her symptoms are seasonal allergy related, typically in spring. Fragrances/cleaning agents are a trigger as well.   She has used advair diskus in past - this is the only other controller inhaler she recalls using. She thinks it was probably more effective at limiting her rescue inhaler use than flovent.   She does have sinus congestion - using flonase daily. Switched to [employer]. These have improved her sinus congestion. She has never needed sinus surgery. She has seen allergy in past and was on AIT when she was in [place].   She has no heartburn/reflux.   Otherwise pertinent review of systems is negative.  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 Lived in [place], [place]. Smoked for a few years in 1990s. Remote MJ, no vaping. She has a dog and a cat at home.   Past Medical History:  Diagnosis Date   Acne    Anxiety    Asthma    Depression    Migraine      Family History  Problem Relation Age of Onset   Healthy Mother    COPD Paternal  Grandfather        smoked   Asthma Maternal Aunt    Asthma Paternal Aunt      No past surgical history on file.  Social History   Socioeconomic History   Marital status: Married    Spouse name: Not on file   Number of children: Not on file   Years of education: Not on file   Highest education level: Not on file  Occupational History   Not on file  Tobacco Use   Smoking status: Former    Packs/day: 0.25    Years: 4.00    Total pack years: 1.00    Types: Cigarettes    Quit date: 06/02/1993    Years since quitting: 28.5   Smokeless tobacco: Never  Vaping Use   Vaping Use: Never used  Substance and Sexual Activity   Alcohol use: Never   Drug use: Never   Sexual activity: Yes    Birth control/protection: I.U.D.  Other Topics Concern   Not on file  Social History Narrative   Not on file   Social Determinants of Health   Financial Resource Strain: Not on file  Food Insecurity: Not on file  Transportation Needs: Not on file  Physical Activity: Not on file  Stress: Not on file  Social Connections: Not on file  Intimate Partner Violence: Not on file  No Known Allergies   Outpatient Medications Prior to Visit  Medication Sig Dispense Refill   albuterol (PROVENTIL HFA;VENTOLIN HFA) 108 (90 Base) MCG/ACT inhaler Inhale 2 puffs into the lungs every 4 (four) hours as needed for wheezing or shortness of breath. 1 Inhaler 0   buPROPion (WELLBUTRIN XL) 150 MG 24 hr tablet Take 150 mg by mouth daily.   2   citalopram (CELEXA) 40 MG tablet Take 40 mg by mouth daily.   2   fexofenadine (ALLEGRA ALLERGY) 180 MG tablet Take 1 tablet (180 mg total) by mouth daily for 15 days. 15 tablet 0   fluticasone (FLONASE) 50 MCG/ACT nasal spray Place 1 spray into both nostrils daily.     LORazepam (ATIVAN) 0.5 MG tablet Take 0.5 mg by mouth 2 (two) times daily as needed for anxiety.   1   Multiple Vitamins-Minerals (MULTIVITAMIN ADULT) TABS Take 1 tablet by mouth daily.      OnabotulinumtoxinA (BOTOX IJ) Inject as directed. Every 3 months for Migraines     spironolactone (ALDACTONE) 50 MG tablet Take 50 mg by mouth daily.     Topiramate (TOPAMAX PO) Take by mouth.     azithromycin (ZITHROMAX) 250 MG tablet Take 1 tablet (250 mg total) by mouth daily. Take first 2 tablets together, then 1 every day until finished. 6 tablet 0   benztropine (COGENTIN) 0.5 MG tablet Take 0.5 mg by mouth daily.      botulinum toxin Type A 100 Units in sodium chloride (PF) 0.9 % 4 mL 100 Units by Submucosal route once.     cetirizine (ZYRTEC) 10 MG tablet Take 10 mg by mouth daily.     Guaifenesin 1200 MG TB12 Take 1 tablet (1,200 mg total) by mouth 2 (two) times daily. 20 each 0   montelukast (SINGULAIR) 10 MG tablet Take 1 tablet (10 mg total) by mouth at bedtime. 30 tablet 0   predniSONE (DELTASONE) 20 MG tablet Take 3 tabs PO daily x 5 days. 15 tablet 0   promethazine-dextromethorphan (PROMETHAZINE-DM) 6.25-15 MG/5ML syrup Take 5 mLs by mouth 2 (two) times daily as needed for cough. 118 mL 0   tazarotene (AVAGE) 0.1 % cream Apply 1 application topically 3 (three) times a week.     No facility-administered medications prior to visit.       Objective:   Physical Exam:  General appearance: 51 y.o., female, female, NAD, conversant  Eyes: anicteric sclerae; PERRL, tracking appropriately HENT: NCAT; MMM Neck: Trachea midline; no lymphadenopathy, no JVD Lungs: CTAB, no crackles, no wheeze, with normal respiratory effort CV: RRR, no murmur  Abdomen: Soft, non-tender; non-distended, BS present  Extremities: No peripheral edema, warm Skin: Normal turgor and texture; no rash Psych: Appropriate affect Neuro: Alert and oriented to person and place, no focal deficit     Vitals:   11/26/21 0858  BP: 108/66  Pulse: 74  Temp: 98.2 F (36.8 C)  TempSrc: Oral  SpO2: 99%  Weight: 141 lb (64 kg)  Height: 5\' 9"  (1.753 m)   99% on RA BMI Readings from Last 3 Encounters:  11/26/21 20.82  kg/m  07/23/18 21.41 kg/m  05/07/18 21.56 kg/m   Wt Readings from Last 3 Encounters:  11/26/21 141 lb (64 kg)  07/23/18 145 lb (65.8 kg)  05/07/18 146 lb (66.2 kg)     CBC    Component Value Date/Time   WBC 7.8 01/31/2021 2355   RBC 3.87 01/31/2021 2355   HGB 13.0 01/31/2021 2355   HCT 38.5  01/31/2021 2355   PLT 255 01/31/2021 2355   MCV 99.5 01/31/2021 2355   MCH 33.6 01/31/2021 2355   MCHC 33.8 01/31/2021 2355   RDW 12.7 01/31/2021 2355   LYMPHSABS 1.6 01/31/2021 2355   MONOABS 0.6 01/31/2021 2355   EOSABS 0.7 (H) 01/31/2021 2355   BASOSABS 0.1 01/31/2021 2355    Eos 700 in 2022  Chest Imaging: CXR 07/26/21 reviewed by me unremarkable  CTA Chest 07/23/18 reviewed by me with small r base scar    Pulmonary Functions Testing Results:     No data to display              Assessment & Plan:   # uncontrolled persistent asthma # allergic, eosinophilic asthma Strong seasonal component which usually dictates when she needs OCS however she has variety of triggers at work and home that lead to persistent symptoms.   # allergic rhinitis Previously on AIT  Plan: - we will price out options for LABA/ICS - stay on flovent in the meantime - albuterol prn - referral made to allergy - nasal irrigation followed by flonase  - continue allegra  RTC 3 months with pre/post BD spiro   Omar Person, MD [facility] 11/26/2021 9:15 AM

## 2021-11-26 ENCOUNTER — Ambulatory Visit: Payer: BC Managed Care – PPO | Admitting: Student

## 2021-11-26 ENCOUNTER — Telehealth: Payer: Self-pay | Admitting: Student

## 2021-11-26 ENCOUNTER — Encounter: Payer: Self-pay | Admitting: Student

## 2021-11-26 ENCOUNTER — Other Ambulatory Visit (HOSPITAL_COMMUNITY): Payer: Self-pay

## 2021-11-26 VITALS — BP 108/66 | HR 74 | Temp 98.2°F | Ht 69.0 in | Wt 141.0 lb

## 2021-11-26 DIAGNOSIS — J4540 Moderate persistent asthma, uncomplicated: Secondary | ICD-10-CM

## 2021-11-26 DIAGNOSIS — J309 Allergic rhinitis, unspecified: Secondary | ICD-10-CM

## 2021-11-26 NOTE — Telephone Encounter (Signed)
What LABA/ICS would be most affordable for her?

## 2021-11-28 MED ORDER — FLUTICASONE-SALMETEROL 250-50 MCG/ACT IN AEPB: 1.0000 | INHALATION_SPRAY | Freq: Two times a day (BID) | RESPIRATORY_TRACT | 11 refills | Status: DC

## 2021-11-28 NOTE — Telephone Encounter (Signed)
Called and prescription for advair sent to [facility] on [address]

## 2022-02-27 ENCOUNTER — Ambulatory Visit: Payer: Self-pay | Admitting: Allergy

## 2022-03-30 NOTE — Progress Notes (Deleted)
New Patient Note  RE: Jenna Allison MRN: [id number] DOB: 20-Jan-1971 Date of Office Visit: 03/31/2022  Consult requested by: Maryjane Hurter, MD Primary care provider: Patient, No Pcp Per  Chief Complaint: No chief complaint on file.  History of Present Illness: I had the pleasure of seeing Charene Mccallister for initial evaluation at the [facility] on 03/30/2022. She is a 51 y.o. female, who is referred here by Patient, No Pcp Per for the evaluation of allergic rhinitis.  She reports symptoms of ***. Symptoms have been going on for *** years. The symptoms are present *** all year around with worsening in ***. Other triggers include exposure to ***. Anosmia: ***. Headache: ***. She has used *** with ***fair improvement in symptoms. Sinus infections: ***. Previous work up includes: ***. Previous ENT evaluation: ***. Previous sinus imaging: ***. History of nasal polyps: ***. Last eye exam: ***. History of reflux: ***.  11/26/2021 pulm visit: "# uncontrolled persistent asthma # allergic, eosinophilic asthma Strong seasonal component which usually dictates when she needs OCS however she has variety of triggers at work and home that lead to persistent symptoms.    # allergic rhinitis Previously on AIT   Plan: - we will price out options for LABA/ICS - stay on flovent in the meantime - albuterol prn - referral made to allergy - nasal irrigation followed by flonase  - continue allegra"  Assessment and Plan: Dominigue is a 51 y.o. female with: No problem-specific Assessment & Plan notes found for this encounter.  No follow-ups on file.  No orders of the defined types were placed in this encounter.  Lab Orders  No laboratory test(s) ordered today    Other allergy screening: Asthma: {Blank single:19197::"yes","no"} Rhino conjunctivitis: {Blank single:19197::"yes","no"} Food allergy: {Blank single:19197::"yes","no"} Medication allergy: {Blank  single:19197::"yes","no"} Hymenoptera allergy: {Blank single:19197::"yes","no"} Urticaria: {Blank single:19197::"yes","no"} Eczema:{Blank single:19197::"yes","no"} History of recurrent infections suggestive of immunodeficency: {Blank single:19197::"yes","no"}  Diagnostics: Spirometry:  Tracings reviewed. Her effort: {Blank single:19197::"Good reproducible efforts.","It was hard to get consistent efforts and there is a question as to whether this reflects a maximal maneuver.","Poor effort, data can not be interpreted."} FVC: ***L FEV1: ***L, ***% predicted FEV1/FVC ratio: ***% Interpretation: {Blank single:19197::"Spirometry consistent with mild obstructive disease","Spirometry consistent with moderate obstructive disease","Spirometry consistent with severe obstructive disease","Spirometry consistent with possible restrictive disease","Spirometry consistent with mixed obstructive and restrictive disease","Spirometry uninterpretable due to technique","Spirometry consistent with normal pattern","No overt abnormalities noted given today's efforts"}.  Please see scanned spirometry results for details.  Skin Testing: {Blank single:19197::"Select foods","Environmental allergy panel","Environmental allergy panel and select foods","Food allergy panel","None","Deferred due to recent antihistamines use"}. *** Results discussed with patient/family.   Past Medical History: There are no problems to display for this patient.  Past Medical History:  Diagnosis Date   Acne    Anxiety    Asthma    Depression    Migraine    Past Surgical History: No past surgical history on file. Medication List:  Current Outpatient Medications  Medication Sig Dispense Refill   albuterol (PROVENTIL HFA;VENTOLIN HFA) 108 (90 Base) MCG/ACT inhaler Inhale 2 puffs into the lungs every 4 (four) hours as needed for wheezing or shortness of breath. 1 Inhaler 0   buPROPion (WELLBUTRIN XL) 150 MG 24 hr tablet Take 150 mg by  mouth daily.   2   citalopram (CELEXA) 40 MG tablet Take 40 mg by mouth daily.   2   fexofenadine (ALLEGRA ALLERGY) 180 MG tablet Take 1 tablet (180 mg total) by  mouth daily for 15 days. 15 tablet 0   fluticasone (FLONASE) 50 MCG/ACT nasal spray Place 1 spray into both nostrils daily.     fluticasone-salmeterol (ADVAIR DISKUS) 250-50 MCG/ACT AEPB Inhale 1 puff into the lungs in the morning and at bedtime. 1 each 11   LORazepam (ATIVAN) 0.5 MG tablet Take 0.5 mg by mouth 2 (two) times daily as needed for anxiety.   1   Multiple Vitamins-Minerals (MULTIVITAMIN ADULT) TABS Take 1 tablet by mouth daily.     OnabotulinumtoxinA (BOTOX IJ) Inject as directed. Every 3 months for Migraines     spironolactone (ALDACTONE) 50 MG tablet Take 50 mg by mouth daily.     Topiramate (TOPAMAX PO) Take by mouth.     No current facility-administered medications for this visit.   Allergies: No Known Allergies Social History: Social History   Socioeconomic History   Marital status: Married    Spouse name: Not on file   Number of children: Not on file   Years of education: Not on file   Highest education level: Not on file  Occupational History   Not on file  Tobacco Use   Smoking status: Former    Packs/day: 0.25    Years: 4.00    Total pack years: 1.00    Types: Cigarettes    Quit date: 06/02/1993    Years since quitting: 28.8   Smokeless tobacco: Never  Vaping Use   Vaping Use: Never used  Substance and Sexual Activity   Alcohol use: Never   Drug use: Never   Sexual activity: Yes    Birth control/protection: I.U.D.  Other Topics Concern   Not on file  Social History Narrative   Not on file   Social Determinants of Health   Financial Resource Strain: Not on file  Food Insecurity: Not on file  Transportation Needs: Not on file  Physical Activity: Not on file  Stress: Not on file  Social Connections: Not on file   Lives in a ***. Smoking: *** Occupation: ***  Environmental  HistorySurveyor, minerals in the house: Copywriter, advertising in the family room: {Blank single:19197::"yes","no"} Carpet in the bedroom: {Blank single:19197::"yes","no"} Heating: {Blank single:19197::"electric","gas","heat pump"} Cooling: {Blank single:19197::"central","window","heat pump"} Pet: {Blank single:19197::"yes ***","no"}  Family History: Family History  Problem Relation Age of Onset   Healthy Mother    COPD Paternal Grandfather        smoked   Asthma Maternal Aunt    Asthma Paternal Aunt    Problem                               Relation Asthma                                   *** Eczema                                *** Food allergy                          *** Allergic rhino conjunctivitis     ***  Review of Systems  Constitutional:  Negative for appetite change, chills, fever and unexpected weight change.  HENT:  Negative for congestion and rhinorrhea.   Eyes:  Negative for itching.  Respiratory:  Negative  for cough, chest tightness, shortness of breath and wheezing.   Cardiovascular:  Negative for chest pain.  Gastrointestinal:  Negative for abdominal pain.  Genitourinary:  Negative for difficulty urinating.  Skin:  Negative for rash.  Neurological:  Negative for headaches.    Objective: There were no vitals taken for this visit. There is no height or weight on file to calculate BMI. Physical Exam Vitals and nursing note reviewed.  Constitutional:      Appearance: Normal appearance. She is well-developed.  HENT:     Head: Normocephalic and atraumatic.     Right Ear: Tympanic membrane and external ear normal.     Left Ear: Tympanic membrane and external ear normal.     Nose: Nose normal.     Mouth/Throat:     Mouth: Mucous membranes are moist.     Pharynx: Oropharynx is clear.  Eyes:     Conjunctiva/sclera: Conjunctivae normal.  Cardiovascular:     Rate and Rhythm: Normal rate and regular rhythm.     Heart sounds: Normal heart  sounds. No murmur heard.    No friction rub. No gallop.  Pulmonary:     Effort: Pulmonary effort is normal.     Breath sounds: Normal breath sounds. No wheezing, rhonchi or rales.  Musculoskeletal:     Cervical back: Neck supple.  Skin:    General: Skin is warm.     Findings: No rash.  Neurological:     Mental Status: She is alert and oriented to person, place, and time.  Psychiatric:        Behavior: Behavior normal.    The plan was reviewed with the patient/family, and all questions/concerned were addressed.  It was my pleasure to see Canisha today and participate in her care. Please feel free to contact me with any questions or concerns.  Sincerely,  Wyline Mood, DO Allergy & Immunology  [facility] office: [phone] [facility] office: [phone]

## 2022-03-31 ENCOUNTER — Ambulatory Visit: Payer: Self-pay | Admitting: Allergy

## 2023-01-11 NOTE — Progress Notes (Unsigned)
New Patient Note  RE: Jenna Allison MRN: [id number] DOB: 06-Feb-1971 Date of Office Visit: 01/12/2023  Consult requested by: Turmel, Malva Cogan, PA Primary care provider: Patient, No Pcp Per  Chief Complaint: No chief complaint on file.  History of Present Illness: I had the pleasure of seeing Isaiah Laporta for initial evaluation at the [facility] on 01/11/2023. She is a 52 y.o. female, who is referred here by Patient, No Pcp Per for the evaluation of asthma.  She reports symptoms of *** chest tightness, shortness of breath, coughing, wheezing, nocturnal awakenings for *** years. Current medications include *** which help. She reports *** using aerochamber with inhalers. She tried the following inhalers: ***. Main triggers are ***allergies, infections, weather changes, smoke, exercise, pet exposure. In the last month, frequency of symptoms: ***x/week. Frequency of nocturnal symptoms: ***x/month. Frequency of SABA use: ***x/week. Interference with physical activity: ***. Sleep is ***disturbed. In the last 12 months, emergency room visits/urgent care visits/doctor office visits or hospitalizations due to respiratory issues: ***. In the last 12 months, oral steroids courses: ***. Lifetime history of hospitalization for respiratory issues: ***. Prior intubations: ***. Asthma was diagnosed at age *** by ***. History of pneumonia: ***. She was evaluated by allergist ***pulmonologist in the past. Smoking exposure: ***. Up to date with flu vaccine: ***. Up to date with pneumonia vaccine: ***. Up to date with COVID-19 vaccine: ***. Prior Covid-19 infection: ***. History of reflux: ***.  Assessment and Plan: Kelen is a 52 y.o. female with: There are no diagnoses linked to this encounter.   No follow-ups on file.  No orders of the defined types were placed in this encounter.  Lab Orders  No laboratory test(s) ordered today    Other allergy screening: Asthma: {Blank  single:19197::"yes","no"} Rhino conjunctivitis: {Blank single:19197::"yes","no"} Food allergy: {Blank single:19197::"yes","no"} Medication allergy: {Blank single:19197::"yes","no"} Hymenoptera allergy: {Blank single:19197::"yes","no"} Urticaria: {Blank single:19197::"yes","no"} Eczema:{Blank single:19197::"yes","no"} History of recurrent infections suggestive of immunodeficency: {Blank single:19197::"yes","no"}  Diagnostics: Spirometry:  Tracings reviewed. Her effort: {Blank single:19197::"Good reproducible efforts.","It was hard to get consistent efforts and there is a question as to whether this reflects a maximal maneuver.","Poor effort, data can not be interpreted."} FVC: ***L FEV1: ***L, ***% predicted FEV1/FVC ratio: ***% Interpretation: {Blank single:19197::"Spirometry consistent with mild obstructive disease","Spirometry consistent with moderate obstructive disease","Spirometry consistent with severe obstructive disease","Spirometry consistent with possible restrictive disease","Spirometry consistent with mixed obstructive and restrictive disease","Spirometry uninterpretable due to technique","Spirometry consistent with normal pattern","No overt abnormalities noted given today's efforts"}.  Please see scanned spirometry results for details.  Skin Testing: {Blank single:19197::"Select foods","Environmental allergy panel","Environmental allergy panel and select foods","Food allergy panel","None","Deferred due to recent antihistamines use"}. *** Results discussed with patient/family.   Past Medical History: There are no problems to display for this patient.  Past Medical History:  Diagnosis Date  . Acne   . Anxiety   . Asthma   . Depression   . Migraine    Past Surgical History: No past surgical history on file. Medication List:  Current Outpatient Medications  Medication Sig Dispense Refill  . albuterol (PROVENTIL HFA;VENTOLIN HFA) 108 (90 Base) MCG/ACT inhaler Inhale 2  puffs into the lungs every 4 (four) hours as needed for wheezing or shortness of breath. 1 Inhaler 0  . buPROPion (WELLBUTRIN XL) 150 MG 24 hr tablet Take 150 mg by mouth daily.   2  . citalopram (CELEXA) 40 MG tablet Take 40 mg by mouth daily.   2  . fexofenadine (ALLEGRA ALLERGY) 180 MG  tablet Take 1 tablet (180 mg total) by mouth daily for 15 days. 15 tablet 0  . fluticasone (FLONASE) 50 MCG/ACT nasal spray Place 1 spray into both nostrils daily.    . fluticasone-salmeterol (ADVAIR DISKUS) 250-50 MCG/ACT AEPB Inhale 1 puff into the lungs in the morning and at bedtime. 1 each 11  . LORazepam (ATIVAN) 0.5 MG tablet Take 0.5 mg by mouth 2 (two) times daily as needed for anxiety.   1  . Multiple Vitamins-Minerals (MULTIVITAMIN ADULT) TABS Take 1 tablet by mouth daily.    . OnabotulinumtoxinA (BOTOX IJ) Inject as directed. Every 3 months for Migraines    . spironolactone (ALDACTONE) 50 MG tablet Take 50 mg by mouth daily.    . Topiramate (TOPAMAX PO) Take by mouth.     No current facility-administered medications for this visit.   Allergies: No Known Allergies Social History: Social History   Socioeconomic History  . Marital status: Married    Spouse name: Not on file  . Number of children: Not on file  . Years of education: Not on file  . Highest education level: Not on file  Occupational History  . Not on file  Tobacco Use  . Smoking status: Former    Current packs/day: 0.00    Average packs/day: 0.3 packs/day for 4.0 years (1.0 ttl pk-yrs)    Types: Cigarettes    Start date: 06/02/1989    Quit date: 06/02/1993    Years since quitting: 29.6  . Smokeless tobacco: Never  Vaping Use  . Vaping status: Never Used  Substance and Sexual Activity  . Alcohol use: Never  . Drug use: Never  . Sexual activity: Yes    Birth control/protection: I.U.D.  Other Topics Concern  . Not on file  Social History Narrative  . Not on file   Social Determinants of Health   Financial Resource  Strain: Not on file  Food Insecurity: No Food Insecurity (10/31/2021)   Received from [facility], [facility]   Hunger Vital Sign   . Worried About Programme researcher, broadcasting/film/video in the Last Year: Never true   . Ran Out of Food in the Last Year: Never true  Transportation Needs: Not on file  Physical Activity: Not on file  Stress: Not on file  Social Connections: Unknown (10/07/2021)   Received [facility], [facility]   Social Network   . Social Network: Not on file   Lives in a ***. Smoking: *** Occupation: ***  Environmental HistorySurveyor, minerals in the house: Copywriter, advertising in the family room: {Blank single:19197::"yes","no"} Carpet in the bedroom: {Blank single:19197::"yes","no"} Heating: {Blank single:19197::"electric","gas","heat pump"} Cooling: {Blank single:19197::"central","window","heat pump"} Pet: {Blank single:19197::"yes ***","no"}  Family History: Family History  Problem Relation Age of Onset  . Healthy Mother   . COPD Paternal Grandfather        smoked  . Asthma Maternal Aunt   . Asthma Paternal Aunt    Problem                               Relation Asthma                                   *** Eczema                                ***  Food allergy                          *** Allergic rhino conjunctivitis     ***  Review of Systems  Constitutional:  Negative for appetite change, chills, fever and unexpected weight change.  HENT:  Negative for congestion and rhinorrhea.   Eyes:  Negative for itching.  Respiratory:  Negative for cough, chest tightness, shortness of breath and wheezing.   Cardiovascular:  Negative for chest pain.  Gastrointestinal:  Negative for abdominal pain.  Genitourinary:  Negative for difficulty urinating.  Skin:  Negative for rash.  Neurological:  Negative for headaches.   Objective: There were no vitals taken for this visit. There is no height or weight on file to calculate BMI. Physical  Exam Vitals and nursing note reviewed.  Constitutional:      Appearance: Normal appearance. She is well-developed.  HENT:     Head: Normocephalic and atraumatic.     Right Ear: Tympanic membrane and external ear normal.     Left Ear: Tympanic membrane and external ear normal.     Nose: Nose normal.     Mouth/Throat:     Mouth: Mucous membranes are moist.     Pharynx: Oropharynx is clear.  Eyes:     Conjunctiva/sclera: Conjunctivae normal.  Cardiovascular:     Rate and Rhythm: Normal rate and regular rhythm.     Heart sounds: Normal heart sounds. No murmur heard.    No friction rub. No gallop.  Pulmonary:     Effort: Pulmonary effort is normal.     Breath sounds: Normal breath sounds. No wheezing, rhonchi or rales.  Musculoskeletal:     Cervical back: Neck supple.  Skin:    General: Skin is warm.     Findings: No rash.  Neurological:     Mental Status: She is alert and oriented to person, place, and time.  Psychiatric:        Behavior: Behavior normal.  The plan was reviewed with the patient/family, and all questions/concerned were addressed.  It was my pleasure to see Wesleigh today and participate in her care. Please feel free to contact me with any questions or concerns.  Sincerely,  Wyline Mood, DO Allergy & Immunology  [facility] office: [phone] [facility] office: [phone]

## 2023-01-12 ENCOUNTER — Ambulatory Visit (INDEPENDENT_AMBULATORY_CARE_PROVIDER_SITE_OTHER): Payer: BC Managed Care – PPO | Admitting: Allergy

## 2023-01-12 ENCOUNTER — Other Ambulatory Visit: Payer: Self-pay

## 2023-01-12 ENCOUNTER — Encounter: Payer: Self-pay | Admitting: Allergy

## 2023-01-12 VITALS — BP 118/80 | HR 74 | Temp 98.5°F | Resp 18 | Ht 68.0 in | Wt 147.2 lb

## 2023-01-12 DIAGNOSIS — J301 Allergic rhinitis due to pollen: Secondary | ICD-10-CM | POA: Diagnosis not present

## 2023-01-12 DIAGNOSIS — B999 Unspecified infectious disease: Secondary | ICD-10-CM

## 2023-01-12 DIAGNOSIS — Z91038 Other insect allergy status: Secondary | ICD-10-CM | POA: Diagnosis not present

## 2023-01-12 DIAGNOSIS — J4540 Moderate persistent asthma, uncomplicated: Secondary | ICD-10-CM | POA: Diagnosis not present

## 2023-01-12 DIAGNOSIS — J3089 Other allergic rhinitis: Secondary | ICD-10-CM | POA: Diagnosis not present

## 2023-01-12 DIAGNOSIS — J3081 Allergic rhinitis due to animal (cat) (dog) hair and dander: Secondary | ICD-10-CM | POA: Diagnosis not present

## 2023-01-12 MED ORDER — AIRSUPRA 90-80 MCG/ACT IN AERO: 2.0000 | INHALATION_SPRAY | RESPIRATORY_TRACT | 2 refills | Status: AC | PRN

## 2023-01-12 MED ORDER — TRELEGY ELLIPTA 200-62.5-25 MCG/ACT IN AEPB: 1.0000 | INHALATION_SPRAY | Freq: Every day | RESPIRATORY_TRACT | 3 refills | Status: DC

## 2023-01-12 NOTE — Patient Instructions (Addendum)
Today's skin testing:  Positive to grass, weed, trees, mold, dust mites, cat, dog, horse, cockroaches.  Results given.  Asthma Daily controller medication(s): start Trelegy 1 puff once a day and rinse mouth after each use. Sample given. Demonstrated proper use.  This replaces your Advair.  May use Airsupra rescue inhaler 2 puffs every 4 to 6 hours as needed for shortness of breath, chest tightness, coughing, and wheezing. Do not use more than 12 puffs in 24 hours. May use Airsupra rescue inhaler 2 puffs 5 to 15 minutes prior to strenuous physical activities. Rinse mouth after each use.  Coupon given. Sample given. Monitor frequency of use - if you need to use it more than twice per week on a consistent basis let us know.  Breathing control goals:  Full participation in all desired activities (may need albuterol before activity) Albuterol use two times or less a week on average (not counting use with activity) Cough interfering with sleep two times or less a month Oral steroids no more than once a year No hospitalizations   Environmental allergies Start environmental control measures as below. Use over the counter antihistamines such as Zyrtec (cetirizine), Claritin (loratadine), Allegra (fexofenadine), or Xyzal (levocetirizine) daily as needed. May take twice a day during allergy flares. May switch antihistamines every few months. Use Flonase (fluticasone) nasal spray 1-2 sprays per nostril once a day as needed for nasal congestion.  Nasal saline spray (i.e., Simply Saline) or nasal saline lavage (i.e., NeilMed) is recommended as needed and prior to medicated nasal sprays. Consider allergy injections for long term control if above medications do not help the symptoms - handout given.   Infections Keep track of infections and antibiotics use. If persistent will get bloodwork next to look at immune system.   Return in about 2 months (around 03/14/2023). Or sooner if needed.    Reducing Pollen Exposure Pollen seasons: trees (spring), grass (summer) and ragweed/weeds (fall). Keep windows closed in your home and car to lower pollen exposure.  Install air conditioning in the bedroom and throughout the house if possible.  Avoid going out in dry windy days - especially early morning. Pollen counts are highest between 5 - 10 AM and on dry, hot and windy days.  Save outside activities for late afternoon or after a heavy rain, when pollen levels are lower.  Avoid mowing of grass if you have grass pollen allergy. Be aware that pollen can also be transported indoors on people and pets.  Dry your clothes in an automatic dryer rather than hanging them outside where they might collect pollen.  Rinse hair and eyes before bedtime. Mold Control Mold and fungi can grow on a variety of surfaces provided certain temperature and moisture conditions exist.  Outdoor molds grow on plants, decaying vegetation and soil. The major outdoor mold, Alternaria and Cladosporium, are found in very high numbers during hot and dry conditions. Generally, a late summer - fall peak is seen for common outdoor fungal spores. Rain will temporarily lower outdoor mold spore count, but counts rise rapidly when the rainy period ends. The most important indoor molds are Aspergillus and Penicillium. Dark, humid and poorly ventilated basements are ideal sites for mold growth. The next most common sites of mold growth are the bathroom and the kitchen. Outdoor (Seasonal) Mold Control Use air conditioning and keep windows closed. Avoid exposure to decaying vegetation. Avoid leaf raking. Avoid grain handling. Consider wearing a face mask if working in moldy areas.  Indoor (Perennial) Mold  Control  Maintain humidity below 50%. Get rid of mold growth on hard surfaces with water, detergent and, if necessary, 5% bleach (do not mix with other cleaners). Then dry the area completely. If mold covers an area more than 10  square feet, consider hiring an indoor environmental professional. For clothing, washing with soap and water is best. If moldy items cannot be cleaned and dried, throw them away. Remove sources e.g. contaminated carpets. Repair and seal leaking roofs or pipes. Using dehumidifiers in damp basements may be helpful, but empty the water and clean units regularly to prevent mildew from forming. All rooms, especially basements, bathrooms and kitchens, require ventilation and cleaning to deter mold and mildew growth. Avoid carpeting on concrete or damp floors, and storing items in damp areas. Control of House Dust Mite Allergen Dust mite allergens are a common trigger of allergy and asthma symptoms. While they can be found throughout the house, these microscopic creatures thrive in warm, humid environments such as bedding, upholstered furniture and carpeting. Because so much time is spent in the bedroom, it is essential to reduce mite levels there.  Encase pillows, mattresses, and box springs in special allergen-proof fabric covers or airtight, zippered plastic covers.  Bedding should be washed weekly in hot water (130 F) and dried in a hot dryer. Allergen-proof covers are available for comforters and pillows that can't be regularly washed.  Wash the allergy-proof covers every few months. Minimize clutter in the bedroom. Keep pets out of the bedroom.  Keep humidity less than 50% by using a dehumidifier or air conditioning. You can buy a humidity measuring device called a hygrometer to monitor this.  If possible, replace carpets with hardwood, linoleum, or washable area rugs. If that's not possible, vacuum frequently with a vacuum that has a HEPA filter. Remove all upholstered furniture and non-washable window drapes from the bedroom. Remove all non-washable stuffed toys from the bedroom.  Wash stuffed toys weekly. Pet Allergen Avoidance: Contrary to popular opinion, there are no "hypoallergenic" breeds of  dogs or cats. That is because people are not allergic to an animal's hair, but to an allergen found in the animal's saliva, dander (dead skin flakes) or urine. Pet allergy symptoms typically occur within minutes. For some people, symptoms can build up and become most severe 8 to 12 hours after contact with the animal. People with severe allergies can experience reactions in public places if dander has been transported on the pet owners' clothing. Keeping an animal outdoors is only a partial solution, since homes with pets in the yard still have higher concentrations of animal allergens. Before getting a pet, ask your allergist to determine if you are allergic to animals. If your pet is already considered part of your family, try to minimize contact and keep the pet out of the bedroom and other rooms where you spend a great deal of time. As with dust mites, vacuum carpets often or replace carpet with a hardwood floor, tile or linoleum. High-efficiency particulate air (HEPA) cleaners can reduce allergen levels over time. While dander and saliva are the source of cat and dog allergens, urine is the source of allergens from rabbits, hamsters, mice and [place] pigs; so ask a non-allergic family member to clean the animal's cage. If you have a pet allergy, talk to your allergist about the potential for allergy immunotherapy (allergy shots). This strategy can often provide long-term relief. Cockroach Allergen Avoidance Cockroaches are often found in the homes of densely populated urban areas, schools or  commercial buildings, but these creatures can lurk almost anywhere. This does not mean that you have a dirty house or living area. Block all areas where roaches can enter the home. This includes crevices, wall cracks and windows.  Cockroaches need water to survive, so fix and seal all leaky faucets and pipes. Have an exterminator go through the house when your family and pets are gone to eliminate any remaining  roaches. Keep food in lidded containers and put pet food dishes away after your pets are done eating. Vacuum and sweep the floor after meals, and take out garbage and recyclables. Use lidded garbage containers in the kitchen. Wash dishes immediately after use and clean under stoves, refrigerators or toasters where crumbs can accumulate. Wipe off the stove and other kitchen surfaces and cupboards regularly.

## 2023-04-08 ENCOUNTER — Ambulatory Visit: Payer: BC Managed Care – PPO | Admitting: Allergy

## 2023-04-21 ENCOUNTER — Telehealth: Payer: Self-pay | Admitting: Neurology

## 2023-04-21 ENCOUNTER — Encounter: Payer: Self-pay | Admitting: Neurology

## 2023-04-21 NOTE — Telephone Encounter (Signed)
LVM and sent letter in mail informing pt of need to reschedule 05/21/23 appt - office closing early

## 2023-04-28 ENCOUNTER — Ambulatory Visit: Payer: BC Managed Care – PPO | Admitting: Allergy

## 2023-05-03 NOTE — Progress Notes (Unsigned)
Follow Up Note  RE: Jenna Allison MRN: [id number] DOB: 03-02-1971 Date of Office Visit: 05/04/2023  Referring provider: Ceasar Lund, PA Primary care provider: Ceasar Lund, PA  Chief Complaint: No chief complaint on file.  History of Present Illness: I had the pleasure of seeing Jenna Allison for a follow up visit at the [facility] on 05/03/2023. Jenna Allison is a 52 y.o. female, who is being followed for asthma, allergic rhinitis, recurrent infections. Her previous allergy office visit was on 01/12/2023 with Dr. Selena Batten. Today is a regular follow up visit.  Discussed the use of AI scribe software for clinical note transcription with the patient, who gave verbal consent to proceed.  History of Present Illness            Not well controlled moderate persistent asthma Asthma for 30+ years and main triggers are allergies. Lately using albuterol 3-5 timer per week despite being on Advair maintenance. Used to follow with pulm. Today's spirometry showed: normal pattern with no improvement in FEV1 post bronchodilator treatment. Clinically feeling improved.  Daily controller medication(s): start Trelegy 1 puff once a day and rinse mouth after each use. Sample given. Demonstrated proper use.  This replaces your Advair.  May use Airsupra rescue inhaler 2 puffs every 4 to 6 hours as needed for shortness of breath, chest tightness, coughing, and wheezing. Do not use more than 12 puffs in 24 hours. May use Airsupra rescue inhaler 2 puffs 5 to 15 minutes prior to strenuous physical activities. Rinse mouth after each use.  Coupon given. Sample given. Monitor frequency of use - if you need to use it more than twice per week on a consistent basis let us know.  Get spirometry at next visit.   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 On AIT in [place] for 3 years 18 years ago with good benefit and on AIT in [place] for 3 years 10+ years ago with good benefit. Wants to restart injections as allergies seem to trigger her headaches and asthma as well.  Today's skin testing: Positive to grass, weed, trees, mold, dust mites, cat, dog, horse, cockroaches. Start environmental control measures as below. Use over the counter antihistamines such as Zyrtec (cetirizine), Claritin (loratadine), Allegra (fexofenadine), or Xyzal (levocetirizine) daily as needed. May take twice a day during allergy flares. May switch antihistamines every few months. Use Flonase (fluticasone) nasal spray 1-2 sprays per nostril once a day as needed for nasal congestion.  Nasal saline spray (i.e., Simply Saline) or nasal saline lavage (i.e., NeilMed) is recommended as needed and prior to medicated nasal sprays. Consider allergy injections for long term control if above medications do not help the symptoms - handout given.  Asthma must be in better control prior to starting.   Recurrent infections Usually gets 1-2 sinus infections and bronchitis per year. Keep track of infections and antibiotics use. If persistent will get bloodwork next to look at immune system.   Assessment and Plan: Jenna Allison is a 52 y.o. female with: *** Assessment and Plan              No follow-ups on file.  No orders of the defined types were placed in this encounter.  Lab Orders  No laboratory test(s) ordered today  Diagnostics: Spirometry:  Tracings reviewed. Her effort: {Blank single:19197::"Good reproducible efforts.","It was hard to get consistent efforts and there is a question as to whether this reflects a maximal maneuver.","Poor effort, data can not be interpreted."} FVC: ***L FEV1: ***L, ***% predicted FEV1/FVC ratio: ***% Interpretation: {Blank single:19197::"Spirometry consistent  with mild obstructive disease","Spirometry consistent with moderate obstructive disease","Spirometry consistent with severe obstructive disease","Spirometry consistent with possible restrictive disease","Spirometry consistent with mixed obstructive and restrictive disease","Spirometry uninterpretable due to technique","Spirometry consistent with normal pattern","No overt abnormalities noted given today's efforts"}.  Please see scanned spirometry results for details.  Skin Testing: {Blank single:19197::"Select foods","Environmental allergy panel","Environmental allergy panel and select foods","Food allergy panel","None","Deferred due to recent antihistamines use"}. *** Results discussed with patient/family.   Medication List:  Current Outpatient Medications  Medication Sig Dispense Refill   albuterol (PROVENTIL HFA;VENTOLIN HFA) 108 (90 Base) MCG/ACT inhaler Inhale 2 puffs into the lungs every 4 (four) hours as needed for wheezing or shortness of breath. 1 Inhaler 0   Albuterol-Budesonide (AIRSUPRA) 90-80 MCG/ACT AERO Inhale 2 puffs into the lungs every 4 (four) hours as needed (coughing, wheezing, chest tightness). Do not exceed 12 puffs in 24 hours. 10.7 g 2   buPROPion (WELLBUTRIN XL) 150 MG 24 hr tablet Take 150 mg by mouth daily.   2   citalopram (CELEXA) 40 MG tablet Take 40 mg by mouth daily.   2   fexofenadine (ALLEGRA ALLERGY) 180 MG tablet Take 1 tablet (180 mg total) by mouth daily for 15 days. 15 tablet 0   fluticasone (FLONASE) 50 MCG/ACT nasal spray Place 1 spray into both nostrils daily.     Fluticasone-Umeclidin-Vilant (TRELEGY ELLIPTA) 200-62.5-25 MCG/ACT AEPB Inhale 1 puff into the lungs daily. Rinse mouth after each use. 60 each 3   gabapentin (NEURONTIN) 100 MG capsule TAKE 1 CAPSULE BY MOUTH 3 TIMES A DAY AS NEEDED FOR ANXIETY     LORazepam (ATIVAN) 0.5 MG tablet Take 0.5 mg by mouth 2 (two) times daily as needed for anxiety.   1   Multiple Vitamins-Minerals (MULTIVITAMIN  ADULT) TABS Take 1 tablet by mouth daily.     OnabotulinumtoxinA (BOTOX IJ) Inject as directed. Every 3 months for Migraines     spironolactone (ALDACTONE) 50 MG tablet Take 50 mg by mouth daily.     Topiramate (TOPAMAX PO) Take by mouth.     No current facility-administered medications for this visit.   Allergies: No Known Allergies I reviewed her past medical history, social history, family history, and environmental history and no significant changes have been reported from her previous visit.  Review of Systems  Constitutional:  Negative for appetite change, chills, fever and unexpected weight change.  HENT:  Negative for congestion and rhinorrhea.   Eyes:  Negative for itching.  Respiratory:  Positive for cough, chest tightness, shortness of breath and wheezing.   Cardiovascular:  Negative for chest pain.  Gastrointestinal:  Negative for abdominal pain.  Genitourinary:  Negative for difficulty urinating.  Skin:  Negative for rash.  Allergic/Immunologic: Positive for environmental allergies.  Neurological:  Positive for headaches.    Objective: There were no vitals taken for this visit. There is no height or weight on file to calculate BMI. Physical Exam Vitals and nursing note reviewed.  Constitutional:      Appearance: Normal appearance. Jenna Allison is well-developed.  HENT:     Head: Normocephalic and atraumatic.     Right Ear: Tympanic membrane and external ear normal.     Left Ear: Tympanic membrane and external  ear normal.     Nose: Nose normal.     Mouth/Throat:     Mouth: Mucous membranes are moist.     Pharynx: Oropharynx is clear.  Eyes:     Conjunctiva/sclera: Conjunctivae normal.  Cardiovascular:     Rate and Rhythm: Normal rate and regular rhythm.     Heart sounds: Normal heart sounds. No murmur heard.    No friction rub. No gallop.  Pulmonary:     Effort: Pulmonary effort is normal.     Breath sounds: Normal breath sounds. No wheezing, rhonchi or rales.   Musculoskeletal:     Cervical back: Neck supple.  Skin:    General: Skin is warm.     Findings: No rash.  Neurological:     Mental Status: Jenna Allison is alert and oriented to person, place, and time.  Psychiatric:        Behavior: Behavior normal.    Previous notes and tests were reviewed. The plan was reviewed with the patient/family, and all questions/concerned were addressed.  It was my pleasure to see Jenna Allison today and participate in her care. Please feel free to contact me with any questions or concerns.  Sincerely,  Wyline Mood, DO Allergy & Immunology  [facility] office: [phone] [facility] office: [phone]

## 2023-05-04 ENCOUNTER — Ambulatory Visit: Payer: BC Managed Care – PPO | Admitting: Allergy

## 2023-05-04 ENCOUNTER — Encounter: Payer: Self-pay | Admitting: Allergy

## 2023-05-04 ENCOUNTER — Other Ambulatory Visit: Payer: Self-pay

## 2023-05-04 VITALS — BP 116/70 | HR 71 | Temp 97.6°F | Resp 16

## 2023-05-04 DIAGNOSIS — J3081 Allergic rhinitis due to animal (cat) (dog) hair and dander: Secondary | ICD-10-CM

## 2023-05-04 DIAGNOSIS — J301 Allergic rhinitis due to pollen: Secondary | ICD-10-CM

## 2023-05-04 DIAGNOSIS — J4540 Moderate persistent asthma, uncomplicated: Secondary | ICD-10-CM | POA: Diagnosis not present

## 2023-05-04 DIAGNOSIS — B370 Candidal stomatitis: Secondary | ICD-10-CM

## 2023-05-04 DIAGNOSIS — J3089 Other allergic rhinitis: Secondary | ICD-10-CM

## 2023-05-04 DIAGNOSIS — B999 Unspecified infectious disease: Secondary | ICD-10-CM

## 2023-05-04 DIAGNOSIS — Z91038 Other insect allergy status: Secondary | ICD-10-CM

## 2023-05-04 MED ORDER — FLUCONAZOLE 150 MG PO TABS: 150.0000 mg | ORAL_TABLET | Freq: Every day | ORAL | 0 refills | Status: DC

## 2023-05-04 MED ORDER — BREZTRI AEROSPHERE 160-9-4.8 MCG/ACT IN AERO: 2.0000 | INHALATION_SPRAY | Freq: Two times a day (BID) | RESPIRATORY_TRACT | 3 refills | Status: AC

## 2023-05-04 MED ORDER — EPINEPHRINE 0.3 MG/0.3ML IJ SOAJ: 0.3000 mg | INTRAMUSCULAR | 1 refills | Status: AC | PRN

## 2023-05-04 NOTE — Patient Instructions (Addendum)
Oral thrush Take diflucan 1 tablet and if no improvement after 72 hours then take the second dose.   Asthma Daily controller medication(s): start Breztri 2 puffs twice a day with spacer and rinse mouth afterwards. Use alcohol free Listerine mouthwash. Spacer given and demonstrated proper use with inhaler. Patient understood technique and all questions/concerned were addressed.  STOP Trelegy. May use Airsupra rescue inhaler 2 puffs every 4 to 6 hours as needed for shortness of breath, chest tightness, coughing, and wheezing. Do not use more than 12 puffs in 24 hours. May use Airsupra rescue inhaler 2 puffs 5 to 15 minutes prior to strenuous physical activities. Rinse mouth after each use.  Monitor frequency of use - if you need to use it more than twice per week on a consistent basis let us know.  Breathing control goals:  Full participation in all desired activities (may need albuterol before activity) Albuterol use two times or less a week on average (not counting use with activity) Cough interfering with sleep two times or less a month Oral steroids no more than once a year No hospitalizations   Environmental allergies 2024 skin testing positive to grass, weed, trees, mold, dust mites, cat, dog, horse, cockroaches. Continue environmental control measures as below. Use over the counter antihistamines such as Zyrtec (cetirizine), Claritin (loratadine), Allegra (fexofenadine), or Xyzal (levocetirizine) daily as needed. May take twice a day during allergy flares. May switch antihistamines every few months. Use Flonase (fluticasone) nasal spray 1-2 sprays per nostril once a day as needed for nasal congestion.  Nasal saline spray (i.e., Simply Saline) or nasal saline lavage (i.e., NeilMed) is recommended as needed and prior to medicated nasal sprays. Start allergy injections - 2 shots.  Make first allergy injection appointment.  Had a detailed discussion with patient/family that clinical history  is suggestive of allergic rhinitis, and may benefit from allergy immunotherapy (AIT). Discussed in detail regarding the dosing, schedule, side effects (mild to moderate local allergic reaction and rarely systemic allergic reactions including anaphylaxis), and benefits (significant improvement in nasal symptoms, seasonal flares of asthma) of immunotherapy with the patient. There is significant time commitment involved with allergy shots, which includes weekly immunotherapy injections for first 9-12 months and then biweekly to monthly injections for 3-5 years. Consent was signed. I have prescribed epinephrine injectable and demonstrated proper use. For mild symptoms you can take over the counter antihistamines such as Benadryl and monitor symptoms closely. If symptoms worsen or if you have severe symptoms including breathing issues, throat closure, significant swelling, whole body hives, severe diarrhea and vomiting, lightheadedness then inject epinephrine and seek immediate medical care afterwards. Emergency action plan given.  Infections Keep track of infections and antibiotics use. If persistent will get bloodwork next to look at immune system.   Return in about 3 months (around 08/02/2023). Or sooner if needed.   Reducing Pollen Exposure Pollen seasons: trees (spring), grass (summer) and ragweed/weeds (fall). Keep windows closed in your home and car to lower pollen exposure.  Install air conditioning in the bedroom and throughout the house if possible.  Avoid going out in dry windy days - especially early morning. Pollen counts are highest between 5 - 10 AM and on dry, hot and windy days.  Save outside activities for late afternoon or after a heavy rain, when pollen levels are lower.  Avoid mowing of grass if you have grass pollen allergy. Be aware that pollen can also be transported indoors on people and pets.  Dry your clothes in  an automatic dryer rather than hanging them outside where they  might collect pollen.  Rinse hair and eyes before bedtime. Mold Control Mold and fungi can grow on a variety of surfaces provided certain temperature and moisture conditions exist.  Outdoor molds grow on plants, decaying vegetation and soil. The major outdoor mold, Alternaria and Cladosporium, are found in very high numbers during hot and dry conditions. Generally, a late summer - fall peak is seen for common outdoor fungal spores. Rain will temporarily lower outdoor mold spore count, but counts rise rapidly when the rainy period ends. The most important indoor molds are Aspergillus and Penicillium. Dark, humid and poorly ventilated basements are ideal sites for mold growth. The next most common sites of mold growth are the bathroom and the kitchen. Outdoor (Seasonal) Mold Control Use air conditioning and keep windows closed. Avoid exposure to decaying vegetation. Avoid leaf raking. Avoid grain handling. Consider wearing a face mask if working in moldy areas.  Indoor (Perennial) Mold Control  Maintain humidity below 50%. Get rid of mold growth on hard surfaces with water, detergent and, if necessary, 5% bleach (do not mix with other cleaners). Then dry the area completely. If mold covers an area more than 10 square feet, consider hiring an indoor environmental professional. For clothing, washing with soap and water is best. If moldy items cannot be cleaned and dried, throw them away. Remove sources e.g. contaminated carpets. Repair and seal leaking roofs or pipes. Using dehumidifiers in damp basements may be helpful, but empty the water and clean units regularly to prevent mildew from forming. All rooms, especially basements, bathrooms and kitchens, require ventilation and cleaning to deter mold and mildew growth. Avoid carpeting on concrete or damp floors, and storing items in damp areas. Control of House Dust Mite Allergen Dust mite allergens are a common trigger of allergy and asthma symptoms.  While they can be found throughout the house, these microscopic creatures thrive in warm, humid environments such as bedding, upholstered furniture and carpeting. Because so much time is spent in the bedroom, it is essential to reduce mite levels there.  Encase pillows, mattresses, and box springs in special allergen-proof fabric covers or airtight, zippered plastic covers.  Bedding should be washed weekly in hot water (130 F) and dried in a hot dryer. Allergen-proof covers are available for comforters and pillows that can't be regularly washed.  Wash the allergy-proof covers every few months. Minimize clutter in the bedroom. Keep pets out of the bedroom.  Keep humidity less than 50% by using a dehumidifier or air conditioning. You can buy a humidity measuring device called a hygrometer to monitor this.  If possible, replace carpets with hardwood, linoleum, or washable area rugs. If that's not possible, vacuum frequently with a vacuum that has a HEPA filter. Remove all upholstered furniture and non-washable window drapes from the bedroom. Remove all non-washable stuffed toys from the bedroom.  Wash stuffed toys weekly. Pet Allergen Avoidance: Contrary to popular opinion, there are no "hypoallergenic" breeds of dogs or cats. That is because people are not allergic to an animal's hair, but to an allergen found in the animal's saliva, dander (dead skin flakes) or urine. Pet allergy symptoms typically occur within minutes. For some people, symptoms can build up and become most severe 8 to 12 hours after contact with the animal. People with severe allergies can experience reactions in public places if dander has been transported on the pet owners' clothing. Keeping an animal outdoors is only a partial  solution, since homes with pets in the yard still have higher concentrations of animal allergens. Before getting a pet, ask your allergist to determine if you are allergic to animals. If your pet is already  considered part of your family, try to minimize contact and keep the pet out of the bedroom and other rooms where you spend a great deal of time. As with dust mites, vacuum carpets often or replace carpet with a hardwood floor, tile or linoleum. High-efficiency particulate air (HEPA) cleaners can reduce allergen levels over time. While dander and saliva are the source of cat and dog allergens, urine is the source of allergens from rabbits, hamsters, mice and [place] pigs; so ask a non-allergic family member to clean the animal's cage. If you have a pet allergy, talk to your allergist about the potential for allergy immunotherapy (allergy shots). This strategy can often provide long-term relief. Cockroach Allergen Avoidance Cockroaches are often found in the homes of densely populated urban areas, schools or commercial buildings, but these creatures can lurk almost anywhere. This does not mean that you have a dirty house or living area. Block all areas where roaches can enter the home. This includes crevices, wall cracks and windows.  Cockroaches need water to survive, so fix and seal all leaky faucets and pipes. Have an exterminator go through the house when your family and pets are gone to eliminate any remaining roaches. Keep food in lidded containers and put pet food dishes away after your pets are done eating. Vacuum and sweep the floor after meals, and take out garbage and recyclables. Use lidded garbage containers in the kitchen. Wash dishes immediately after use and clean under stoves, refrigerators or toasters where crumbs can accumulate. Wipe off the stove and other kitchen surfaces and cupboards regularly.

## 2023-05-05 DIAGNOSIS — J3089 Other allergic rhinitis: Secondary | ICD-10-CM | POA: Diagnosis not present

## 2023-05-05 NOTE — Progress Notes (Signed)
VIALS EXP 06-25-23 

## 2023-05-05 NOTE — Progress Notes (Signed)
Aeroallergen Immunotherapy  Ordering Provider: Dr. Wyline Mood  Patient Details Name: Jenna Allison MRN: [id number] Date of Birth: 10-Jun-1970  Order 1 of 2  Vial Label: G-RW-W-T-DM  0.3 ml (Volume)  BAU Concentration -- 7 Grass Mix* 100,000 ([address] [place], [place], [place], [place], [place], Sweet [place]al, [place]) 0.2 ml (Volume)  1:20 Concentration -- [place] 0.3 ml (Volume)  BAU Concentration -- [place] 10,000 0.2 ml (Volume)  1:20 Concentration -- [place] 0.3 ml (Volume)  1:20 Concentration -- Ragweed Mix 0.5 ml (Volume)  1:20 Concentration -- Weed Mix* 0.5 ml (Volume)  1:20 Concentration -- Eastern 10 Tree Mix (also Sweet Gum) 0.2 ml (Volume)  1:10 Concentration -- Pecan Pollen 0.2 ml (Volume)  1:20 Concentration -- Red Mulberry 0.5 ml (Volume)   AU Concentration -- Mite Mix (DF 5,000 & DP 5,000)   3.2  ml Extract Subtotal 1.8  ml Diluent 5.0  ml Maintenance Total  Schedule:  B Blue Vial (1:100,000): Schedule B (6 doses) Yellow Vial (1:10,000): Schedule B (6 doses) Green Vial (1:1,000): Schedule B (6 doses) Red Vial (1:100): Schedule A (14 doses)  Special Instructions: may come in 1-2 times per week during build up. Once per week once on red vial.

## 2023-05-05 NOTE — Progress Notes (Signed)
Aeroallergen Immunotherapy  Ordering Provider: Dr. Wyline Mood  Patient Details Name: Jenna Allison MRN: [id number] Date of Birth: 07/20/1970  Order 2 of 2  Vial Label: M-C-D-Cr  0.2 ml (Volume)  1:20 Concentration -- Alternaria alternata 0.2 ml (Volume)  1:20 Concentration -- Cladosporium herbarum 0.2 ml (Volume)  1:10 Concentration -- Aspergillus mix 0.2 ml (Volume)  1:10 Concentration -- Penicillium mix 0.2 ml (Volume)  1:20 Concentration -- Bipolaris sorokiniana 0.2 ml (Volume)  1:20 Concentration -- Drechslera spicifera 0.2 ml (Volume)  1:10 Concentration -- Fusarium moniliforme 0.2 ml (Volume)  1:40 Concentration -- Botrytis cinerea 0.2 ml (Volume)  1:40 Concentration -- Epicoccum nigrum 0.2 ml (Volume)  1:40 Concentration -- Phoma betae 0.5 ml (Volume)  1:10 Concentration -- Cat Hair 0.3 ml (Volume)  1:20 Concentration -- Cockroach, [place] 0.5 ml (Volume)  1:10 Concentration -- Dog Epithelia  3.3  ml Extract Subtotal 1.7  ml Diluent 5.0  ml Maintenance Total  Schedule:  B Blue Vial (1:100,000): Schedule B (6 doses) Yellow Vial (1:10,000): Schedule B (6 doses) Green Vial (1:1,000): Schedule B (6 doses) Red Vial (1:100): Schedule A (14 doses)  Special Instructions: may come in 1-2 times per week during build up. Once per week once on red vial.

## 2023-05-06 DIAGNOSIS — J3081 Allergic rhinitis due to animal (cat) (dog) hair and dander: Secondary | ICD-10-CM | POA: Diagnosis not present

## 2023-05-19 ENCOUNTER — Other Ambulatory Visit: Payer: Self-pay | Admitting: Allergy

## 2023-05-21 ENCOUNTER — Ambulatory Visit: Payer: BC Managed Care – PPO | Admitting: Neurology

## 2023-05-25 ENCOUNTER — Ambulatory Visit: Payer: BC Managed Care – PPO

## 2023-06-18 ENCOUNTER — Ambulatory Visit: Payer: 59

## 2023-06-18 DIAGNOSIS — J309 Allergic rhinitis, unspecified: Secondary | ICD-10-CM

## 2023-06-18 NOTE — Progress Notes (Signed)
Immunotherapy   Patient Details  Name: Jenna Allison MRN: [id number] Date of Birth: 16-Apr-1971  06/18/2023  Jenna Allison started injections for G-RW-W-T-DM and M-C-D-CR. Patient received 0.05 ml of both her blue vials with an expiration of 05/04/2024. Patient waited 30 minutes with no problems. Following schedule: B  Frequency:2 times per week Epi-Pen:Epi-Pen Available  Consent signed and patient instructions given.   Dub Mikes 06/18/2023, 5:04 PM

## 2023-06-30 ENCOUNTER — Ambulatory Visit (INDEPENDENT_AMBULATORY_CARE_PROVIDER_SITE_OTHER): Payer: Self-pay | Admitting: *Deleted

## 2023-06-30 DIAGNOSIS — J309 Allergic rhinitis, unspecified: Secondary | ICD-10-CM | POA: Diagnosis not present

## 2023-07-02 ENCOUNTER — Ambulatory Visit (INDEPENDENT_AMBULATORY_CARE_PROVIDER_SITE_OTHER): Payer: 59 | Admitting: *Deleted

## 2023-07-02 DIAGNOSIS — J309 Allergic rhinitis, unspecified: Secondary | ICD-10-CM | POA: Diagnosis not present

## 2023-07-07 ENCOUNTER — Ambulatory Visit: Payer: Self-pay | Admitting: Neurology

## 2023-07-09 ENCOUNTER — Ambulatory Visit (INDEPENDENT_AMBULATORY_CARE_PROVIDER_SITE_OTHER): Payer: 59

## 2023-07-09 DIAGNOSIS — J309 Allergic rhinitis, unspecified: Secondary | ICD-10-CM

## 2023-07-16 ENCOUNTER — Ambulatory Visit (INDEPENDENT_AMBULATORY_CARE_PROVIDER_SITE_OTHER): Payer: Self-pay | Admitting: *Deleted

## 2023-07-16 DIAGNOSIS — J309 Allergic rhinitis, unspecified: Secondary | ICD-10-CM

## 2023-07-21 ENCOUNTER — Ambulatory Visit (INDEPENDENT_AMBULATORY_CARE_PROVIDER_SITE_OTHER): Payer: Self-pay | Admitting: *Deleted

## 2023-07-21 DIAGNOSIS — J309 Allergic rhinitis, unspecified: Secondary | ICD-10-CM

## 2023-08-23 NOTE — Progress Notes (Unsigned)
 Follow Up Note  RE: Jenna Allison MRN: [id number] DOB: 1971/01/14 Date of Office Visit: 08/24/2023  Referring provider: Ceasar Lund, PA Primary care provider: Ceasar Lund, PA  Chief Complaint: No chief complaint on file.  History of Present Illness: I had the pleasure of seeing Jenna Allison for a follow up visit at the [facility] on 08/23/2023. She is a 53 y.o. female, who is being followed for asthma, allergic rhinitis, recurrent infections. Her previous allergy office visit was on May 04, 2023 with Dr. Selena Batten. Today is a regular follow up visit.  Discussed the use of AI scribe software for clinical note transcription with the patient, who gave verbal consent to proceed.  History of Present Illness            ***  Assessment and Plan: Jenna Allison is a 53 y.o. female with: Asthma  Past history - asthma for 30+ years and main triggers are allergies. Lately using albuterol 3-5 times per week despite being on Advair maintenance. Used to follow with pulm. 2024 spirometry showed: normal pattern with no improvement in FEV1 post bronchodilator treatment. Clinically feeling improved.  Interim history - Trelegy helped but having thrush.  Today's spirometry was unremarkable.  Daily controller medication(s): start Breztri 2 puffs twice a day with spacer and rinse mouth afterwards. Sample given.  Use alcohol free Listerine mouthwash. Spacer given and demonstrated proper use with inhaler. Patient understood technique and all questions/concerned were addressed.  STOP Trelegy. May use Airsupra rescue inhaler 2 puffs every 4 to 6 hours as needed for shortness of breath, chest tightness, coughing, and wheezing. Do not use more than 12 puffs in 24 hours. May use Airsupra rescue inhaler 2 puffs 5 to 15 minutes prior to strenuous physical activities. Rinse mouth after each use.  Monitor frequency of use - if you need to use it more than twice per week on a consistent basis let  us know.     Seasonal allergic rhinitis due to pollen Allergic rhinitis due to animal dander Allergic rhinitis due to dust mite Allergic rhinitis due to mold Allergy to cockroaches Past history - perennial rhino conjunctivitis symptoms which flare in the spring and fall. On AIT in [place] for 3 years 18 years ago with good benefit and on AIT in [place] for 3 years 10+ years ago with good benefit. Wants to restart injections as allergies seem to trigger her headaches and asthma as well.  2024 skin testing positive to grass, weed, trees, mold, dust mites, cat, dog, horse, cockroaches. Interim history - wants to start AIT.  Continue environmental control measures as below. Use over the counter antihistamines such as Zyrtec (cetirizine), Claritin (loratadine), Allegra (fexofenadine), or Xyzal (levocetirizine) daily as needed. May take twice a day during allergy flares. May switch antihistamines every few months. Use Flonase (fluticasone) nasal spray 1-2 sprays per nostril once a day as needed for nasal congestion.  Nasal saline spray (i.e., Simply Saline) or nasal saline lavage (i.e., NeilMed) is recommended as needed and prior to medicated nasal sprays. Start allergy injections - 2 shots.  Make first allergy injection appointment.  Had a detailed discussion with patient/family that clinical history is suggestive of allergic rhinitis, and may benefit from allergy immunotherapy (AIT). Discussed in detail regarding the dosing, schedule, side effects (mild to moderate local allergic reaction and rarely systemic allergic reactions including anaphylaxis), and benefits (significant improvement in nasal symptoms, seasonal flares of asthma) of immunotherapy with the patient. There is  significant time commitment involved with allergy shots, which includes weekly immunotherapy injections for first 9-12 months and then biweekly to monthly injections for 3-5 years. Consent was signed. I have prescribed epinephrine  injectable and demonstrated proper use. For mild symptoms you can take over the counter antihistamines such as Benadryl and monitor symptoms closely. If symptoms worsen or if you have severe symptoms including breathing issues, throat closure, significant swelling, whole body hives, severe diarrhea and vomiting, lightheadedness then inject epinephrine and seek immediate medical care afterwards. Emergency action plan given.   Recurrent infections Past history - 1-2 sinus infections and bronchitis per year. Interim history - 1 course of antibiotics since last OV. Keep track of infections and antibiotics use. If persistent will get bloodwork next to look at immune system.  Assessment and Plan              No follow-ups on file.  No orders of the defined types were placed in this encounter.  Lab Orders  No laboratory test(s) ordered today    Diagnostics: Spirometry:  Tracings reviewed. Her effort: {Blank single:19197::"Good reproducible efforts.","It was hard to get consistent efforts and there is a question as to whether this reflects a maximal maneuver.","Poor effort, data can not be interpreted."} FVC: ***L FEV1: ***L, ***% predicted FEV1/FVC ratio: ***% Interpretation: {Blank single:19197::"Spirometry consistent with mild obstructive disease","Spirometry consistent with moderate obstructive disease","Spirometry consistent with severe obstructive disease","Spirometry consistent with possible restrictive disease","Spirometry consistent with mixed obstructive and restrictive disease","Spirometry uninterpretable due to technique","Spirometry consistent with normal pattern","No overt abnormalities noted given today's efforts"}.  Please see scanned spirometry results for details.  Skin Testing: {Blank single:19197::"Select foods","Environmental allergy panel","Environmental allergy panel and select foods","Food allergy panel","None","Deferred due to recent antihistamines  use"}. *** Results discussed with patient/family.   Medication List:  Current Outpatient Medications  Medication Sig Dispense Refill   albuterol (PROVENTIL HFA;VENTOLIN HFA) 108 (90 Base) MCG/ACT inhaler Inhale 2 puffs into the lungs every 4 (four) hours as needed for wheezing or shortness of breath. 1 Inhaler 0   Albuterol-Budesonide (AIRSUPRA) 90-80 MCG/ACT AERO Inhale 2 puffs into the lungs every 4 (four) hours as needed (coughing, wheezing, chest tightness). Do not exceed 12 puffs in 24 hours. 10.7 g 2   amoxicillin-clavulanate (AUGMENTIN) 875-125 MG tablet Take 1 tablet by mouth 2 (two) times daily.     Budeson-Glycopyrrol-Formoterol (BREZTRI AEROSPHERE) 160-9-4.8 MCG/ACT AERO Inhale 2 puffs into the lungs in the morning and at bedtime. with spacer and rinse mouth afterwards. 10.7 g 3   buPROPion (WELLBUTRIN XL) 150 MG 24 hr tablet Take 150 mg by mouth daily.   2   cetirizine (ZYRTEC ALLERGY) 10 MG tablet Take 10 mg by mouth daily.     citalopram (CELEXA) 40 MG tablet Take 40 mg by mouth daily.   2   eletriptan (RELPAX) 40 MG tablet Take 40 mg by mouth every 2 (two) hours as needed for migraine.     EPINEPHrine 0.3 mg/0.3 mL IJ SOAJ injection Inject 0.3 mg into the muscle as needed for anaphylaxis. 2 each 1   fexofenadine (ALLEGRA ALLERGY) 180 MG tablet Take 1 tablet (180 mg total) by mouth daily for 15 days. 15 tablet 0   fluconazole (DIFLUCAN) 150 MG tablet Take 1 tablet (150 mg total) by mouth daily. If no improvement after 72 hours then take second tablet. 2 tablet 0   fluticasone (FLONASE) 50 MCG/ACT nasal spray Place 1 spray into both nostrils daily.     gabapentin (NEURONTIN) 100 MG capsule TAKE  1 CAPSULE BY MOUTH 3 TIMES A DAY AS NEEDED FOR ANXIETY     LORazepam (ATIVAN) 0.5 MG tablet Take 0.5 mg by mouth 2 (two) times daily as needed for anxiety.  (Patient not taking: Reported on 05/04/2023)  1   Multiple Vitamins-Minerals (MULTIVITAMIN ADULT) TABS Take 1 tablet by mouth daily.      OnabotulinumtoxinA (BOTOX IJ) Inject as directed. Every 3 months for Migraines     propranolol (INDERAL) 20 MG tablet Take 20 mg by mouth 2 (two) times daily.     spironolactone (ALDACTONE) 50 MG tablet Take 50 mg by mouth daily.     Topiramate (TOPAMAX PO) Take by mouth.     TRELEGY ELLIPTA 200-62.5-25 MCG/ACT AEPB INHALE 1 PUFF INTO THE LUNGS DAILY. RINSE MOUTH AFTER USE 60 each 1   No current facility-administered medications for this visit.   Allergies: No Known Allergies I reviewed her past medical history, social history, family history, and environmental history and no significant changes have been reported from her previous visit.  Review of Systems  Constitutional:  Negative for appetite change, chills, fever and unexpected weight change.  HENT:  Negative for congestion and rhinorrhea.   Eyes:  Negative for itching.  Respiratory:  Negative for cough, chest tightness, shortness of breath and wheezing.   Cardiovascular:  Negative for chest pain.  Gastrointestinal:  Negative for abdominal pain.  Genitourinary:  Negative for difficulty urinating.  Skin:  Negative for rash.  Allergic/Immunologic: Positive for environmental allergies.  Neurological:  Positive for headaches.    Objective: There were no vitals taken for this visit. There is no height or weight on file to calculate BMI. Physical Exam Vitals and nursing note reviewed.  Constitutional:      Appearance: Normal appearance. She is well-developed.  HENT:     Head: Normocephalic and atraumatic.     Right Ear: Tympanic membrane and external ear normal.     Left Ear: Tympanic membrane and external ear normal.     Nose: Nose normal.     Mouth/Throat:     Mouth: Mucous membranes are moist.     Comments: Thrush present Eyes:     Conjunctiva/sclera: Conjunctivae normal.  Cardiovascular:     Rate and Rhythm: Normal rate and regular rhythm.     Heart sounds: Normal heart sounds. No murmur heard.    No friction rub.  No gallop.  Pulmonary:     Effort: Pulmonary effort is normal.     Breath sounds: Normal breath sounds. No wheezing, rhonchi or rales.  Musculoskeletal:     Cervical back: Neck supple.  Skin:    General: Skin is warm.     Findings: No rash.  Neurological:     Mental Status: She is alert and oriented to person, place, and time.  Psychiatric:        Behavior: Behavior normal.    Previous notes and tests were reviewed. The plan was reviewed with the patient/family, and all questions/concerned were addressed.  It was my pleasure to see Favour today and participate in her care. Please feel free to contact me with any questions or concerns.  Sincerely,  Wyline Mood, DO Allergy & Immunology  [facility] office: [phone] [facility] office: [phone]

## 2023-08-24 ENCOUNTER — Other Ambulatory Visit: Payer: Self-pay

## 2023-08-24 ENCOUNTER — Ambulatory Visit: Payer: BC Managed Care – PPO | Admitting: Allergy

## 2023-08-24 ENCOUNTER — Ambulatory Visit: Payer: Self-pay

## 2023-08-24 ENCOUNTER — Encounter: Payer: Self-pay | Admitting: Allergy

## 2023-08-24 VITALS — BP 110/68 | HR 77 | Temp 97.9°F | Resp 16

## 2023-08-24 DIAGNOSIS — J301 Allergic rhinitis due to pollen: Secondary | ICD-10-CM | POA: Diagnosis not present

## 2023-08-24 DIAGNOSIS — J3081 Allergic rhinitis due to animal (cat) (dog) hair and dander: Secondary | ICD-10-CM | POA: Diagnosis not present

## 2023-08-24 DIAGNOSIS — J309 Allergic rhinitis, unspecified: Secondary | ICD-10-CM

## 2023-08-24 DIAGNOSIS — J4550 Severe persistent asthma, uncomplicated: Secondary | ICD-10-CM | POA: Diagnosis not present

## 2023-08-24 DIAGNOSIS — B999 Unspecified infectious disease: Secondary | ICD-10-CM

## 2023-08-24 DIAGNOSIS — J3089 Other allergic rhinitis: Secondary | ICD-10-CM | POA: Diagnosis not present

## 2023-08-24 DIAGNOSIS — J4540 Moderate persistent asthma, uncomplicated: Secondary | ICD-10-CM

## 2023-08-24 DIAGNOSIS — Z91038 Other insect allergy status: Secondary | ICD-10-CM

## 2023-08-24 NOTE — Patient Instructions (Addendum)
 Asthma Get bloodwork in about 4 weeks to see what biologics (injections) for asthma you qualify for. These are some of the options: Dupixent, Xolair, Eliberto Ivory, Tezspire.  Daily controller medication(s): Breztri 2 puffs twice a day with spacer and rinse mouth afterwards. Use alcohol free Listerine mouthwash. May use Airsupra rescue inhaler 2 puffs every 4 to 6 hours as needed for shortness of breath, chest tightness, coughing, and wheezing. Do not use more than 12 puffs in 24 hours. May use Airsupra rescue inhaler 2 puffs 5 to 15 minutes prior to strenuous physical activities. Rinse mouth after each use.  Monitor frequency of use - if you need to use it more than twice per week on a consistent basis let us know.  Breathing control goals:  Full participation in all desired activities (may need albuterol before activity) Albuterol use two times or less a week on average (not counting use with activity) Cough interfering with sleep two times or less a month Oral steroids no more than once a year No hospitalizations   Environmental allergies 2024 skin testing positive to grass, weed, trees, mold, dust mites, cat, dog, horse, cockroaches. Continue environmental control measures.  Use over the counter antihistamines such as Zyrtec (cetirizine), Claritin (loratadine), Allegra (fexofenadine), or Xyzal (levocetirizine) daily as needed. May take twice a day during allergy flares. May switch antihistamines every few months. Use Flonase (fluticasone) nasal spray 1-2 sprays per nostril once a day as needed for nasal congestion.  Nasal saline spray (i.e., Simply Saline) or nasal saline lavage (i.e., NeilMed) is recommended as needed and prior to medicated nasal sprays. Continue allergy injections.   Infections Keep track of infections and antibiotics use.  Get bloodwork We are ordering labs, so please allow 1-2 weeks for the results to come back. With the newly implemented Cures Act, the labs  might be visible to you at the same time that they become visible to me. However, I will not address the results until all of the results are back, so please be patient.  In the meantime, continue recommendations in your patient instructions, including avoidance measures (if applicable), until you hear from me.  Return in about 4 months (around 12/24/2023). Or sooner if needed.

## 2023-08-27 ENCOUNTER — Ambulatory Visit (INDEPENDENT_AMBULATORY_CARE_PROVIDER_SITE_OTHER): Payer: Self-pay

## 2023-08-27 DIAGNOSIS — J309 Allergic rhinitis, unspecified: Secondary | ICD-10-CM

## 2023-09-01 ENCOUNTER — Ambulatory Visit (INDEPENDENT_AMBULATORY_CARE_PROVIDER_SITE_OTHER): Payer: Self-pay | Admitting: *Deleted

## 2023-09-01 DIAGNOSIS — J309 Allergic rhinitis, unspecified: Secondary | ICD-10-CM | POA: Diagnosis not present

## 2023-09-09 ENCOUNTER — Ambulatory Visit (INDEPENDENT_AMBULATORY_CARE_PROVIDER_SITE_OTHER): Payer: Self-pay

## 2023-09-09 DIAGNOSIS — J309 Allergic rhinitis, unspecified: Secondary | ICD-10-CM | POA: Diagnosis not present

## 2023-09-11 ENCOUNTER — Ambulatory Visit (INDEPENDENT_AMBULATORY_CARE_PROVIDER_SITE_OTHER)

## 2023-09-11 DIAGNOSIS — J309 Allergic rhinitis, unspecified: Secondary | ICD-10-CM | POA: Diagnosis not present

## 2023-09-14 ENCOUNTER — Ambulatory Visit: Payer: Self-pay | Admitting: Neurology

## 2023-09-14 ENCOUNTER — Telehealth: Payer: Self-pay | Admitting: Neurology

## 2023-09-14 ENCOUNTER — Encounter: Payer: Self-pay | Admitting: Neurology

## 2023-09-14 VITALS — BP 103/62 | HR 78 | Ht 68.5 in | Wt 152.0 lb

## 2023-09-14 DIAGNOSIS — G43709 Chronic migraine without aura, not intractable, without status migrainosus: Secondary | ICD-10-CM | POA: Insufficient documentation

## 2023-09-14 NOTE — Telephone Encounter (Signed)
 Next Botox will be 12/08/23 or after.

## 2023-09-14 NOTE — Telephone Encounter (Signed)
-----   Message from Jenna Allison sent at 09/14/2023  9:30 AM EDT ----- Regarding: botox G43.709: she would like to transition her botox from [facility] to [facility]. She has a botox appointment tomorrow with [facility] and future botox here. She can see an NP thank you. Please start botox protocol
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
## 2023-09-14 NOTE — Addendum Note (Signed)
 Addended by: Yaxiel Minnie B on: 09/14/2023 10:13 AM   Modules accepted: Level of Service

## 2023-09-15 ENCOUNTER — Other Ambulatory Visit (HOSPITAL_COMMUNITY): Payer: Self-pay

## 2023-09-15 ENCOUNTER — Other Ambulatory Visit: Payer: Self-pay

## 2023-09-15 MED ORDER — ONABOTULINUMTOXINA 200 UNITS IJ SOLR: INTRAMUSCULAR | 3 refills | Status: DC

## 2023-09-15 MED ORDER — ONABOTULINUMTOXINA 200 UNITS IJ SOLR
INTRAMUSCULAR | 3 refills | Status: AC
  Filled 2023-09-15: qty 1, fill #0
  Filled 2023-12-02: qty 1, 84d supply, fill #0
  Filled 2024-03-01: qty 1, 84d supply, fill #1
  Filled 2024-05-18 – 2024-05-31 (×2): qty 1, 84d supply, fill #2

## 2023-09-15 NOTE — Telephone Encounter (Signed)
 Received approval, pt will now fill through [facility]. Please send rx with a note asking them to delay shipment for 11 weeks, pt is seeing [facility] today for Botox.  Auth#: [id number] (09/15/23-09/14/24)

## 2023-09-15 NOTE — Telephone Encounter (Signed)
 Botox 200 unit Rx sent to [facility].   It was inadvertently sent to [facility] but this has been canceled.

## 2023-09-15 NOTE — Telephone Encounter (Signed)
 Submitted auth request via CMM. Key: Jenna Allison

## 2023-09-15 NOTE — Progress Notes (Signed)
 Patient to be enrolled with [facility].

## 2023-09-15 NOTE — Addendum Note (Signed)
 Addended by: Burns Carwin on: 09/15/2023 08:38 AM   Modules accepted: Orders

## 2023-09-16 ENCOUNTER — Ambulatory Visit (INDEPENDENT_AMBULATORY_CARE_PROVIDER_SITE_OTHER): Payer: Self-pay

## 2023-09-16 DIAGNOSIS — J309 Allergic rhinitis, unspecified: Secondary | ICD-10-CM

## 2023-09-17 ENCOUNTER — Other Ambulatory Visit: Payer: Self-pay

## 2023-09-17 ENCOUNTER — Other Ambulatory Visit (HOSPITAL_COMMUNITY): Payer: Self-pay

## 2023-09-23 ENCOUNTER — Other Ambulatory Visit: Payer: Self-pay

## 2023-10-01 ENCOUNTER — Other Ambulatory Visit: Payer: Self-pay

## 2023-10-13 ENCOUNTER — Ambulatory Visit (INDEPENDENT_AMBULATORY_CARE_PROVIDER_SITE_OTHER): Payer: Self-pay

## 2023-10-13 DIAGNOSIS — J309 Allergic rhinitis, unspecified: Secondary | ICD-10-CM

## 2023-10-19 ENCOUNTER — Ambulatory Visit (INDEPENDENT_AMBULATORY_CARE_PROVIDER_SITE_OTHER): Payer: Self-pay

## 2023-10-19 DIAGNOSIS — J309 Allergic rhinitis, unspecified: Secondary | ICD-10-CM

## 2023-10-21 ENCOUNTER — Ambulatory Visit (INDEPENDENT_AMBULATORY_CARE_PROVIDER_SITE_OTHER)

## 2023-10-21 DIAGNOSIS — J309 Allergic rhinitis, unspecified: Secondary | ICD-10-CM | POA: Diagnosis not present

## 2023-10-22 ENCOUNTER — Other Ambulatory Visit (HOSPITAL_COMMUNITY): Payer: Self-pay

## 2023-11-04 ENCOUNTER — Ambulatory Visit (INDEPENDENT_AMBULATORY_CARE_PROVIDER_SITE_OTHER): Payer: Self-pay

## 2023-11-04 DIAGNOSIS — J309 Allergic rhinitis, unspecified: Secondary | ICD-10-CM

## 2023-11-06 ENCOUNTER — Ambulatory Visit (INDEPENDENT_AMBULATORY_CARE_PROVIDER_SITE_OTHER): Payer: Self-pay | Admitting: *Deleted

## 2023-11-06 DIAGNOSIS — J309 Allergic rhinitis, unspecified: Secondary | ICD-10-CM | POA: Diagnosis not present

## 2023-11-11 ENCOUNTER — Ambulatory Visit (INDEPENDENT_AMBULATORY_CARE_PROVIDER_SITE_OTHER): Payer: Self-pay

## 2023-11-11 DIAGNOSIS — J309 Allergic rhinitis, unspecified: Secondary | ICD-10-CM

## 2023-11-27 ENCOUNTER — Ambulatory Visit (INDEPENDENT_AMBULATORY_CARE_PROVIDER_SITE_OTHER)

## 2023-11-27 DIAGNOSIS — J309 Allergic rhinitis, unspecified: Secondary | ICD-10-CM | POA: Diagnosis not present

## 2023-11-30 ENCOUNTER — Other Ambulatory Visit (HOSPITAL_COMMUNITY): Payer: Self-pay

## 2023-12-01 ENCOUNTER — Ambulatory Visit (INDEPENDENT_AMBULATORY_CARE_PROVIDER_SITE_OTHER)

## 2023-12-01 DIAGNOSIS — J309 Allergic rhinitis, unspecified: Secondary | ICD-10-CM | POA: Diagnosis not present

## 2023-12-02 ENCOUNTER — Other Ambulatory Visit: Payer: Self-pay

## 2023-12-02 ENCOUNTER — Other Ambulatory Visit (HOSPITAL_COMMUNITY): Payer: Self-pay

## 2023-12-02 NOTE — Progress Notes (Signed)
 Specialty Pharmacy Initial Fill Coordination Note  Jenna Allison is a 53 y.o. female contacted today regarding initial fill of specialty medication(s) OnabotulinumtoxinA    Patient requested Courier to Provider Office   Delivery date: 12/07/23   Verified address: [facility], [address], [place], [place] [address]   Medication will be filled on 12/03/2023.   Patient is aware of zero copayment.

## 2023-12-03 ENCOUNTER — Ambulatory Visit (INDEPENDENT_AMBULATORY_CARE_PROVIDER_SITE_OTHER)

## 2023-12-03 DIAGNOSIS — J309 Allergic rhinitis, unspecified: Secondary | ICD-10-CM

## 2023-12-08 ENCOUNTER — Ambulatory Visit (INDEPENDENT_AMBULATORY_CARE_PROVIDER_SITE_OTHER)

## 2023-12-08 DIAGNOSIS — J309 Allergic rhinitis, unspecified: Secondary | ICD-10-CM

## 2023-12-09 ENCOUNTER — Encounter: Payer: Self-pay | Admitting: Adult Health

## 2023-12-09 ENCOUNTER — Ambulatory Visit: Admitting: Adult Health

## 2023-12-10 ENCOUNTER — Other Ambulatory Visit: Payer: Self-pay | Admitting: Neurology

## 2023-12-10 ENCOUNTER — Telehealth: Payer: Self-pay | Admitting: Neurology

## 2023-12-10 ENCOUNTER — Ambulatory Visit: Admitting: Adult Health

## 2023-12-10 VITALS — BP 136/70 | HR 78

## 2023-12-10 DIAGNOSIS — G43709 Chronic migraine without aura, not intractable, without status migrainosus: Secondary | ICD-10-CM | POA: Diagnosis not present

## 2023-12-10 DIAGNOSIS — Z0289 Encounter for other administrative examinations: Secondary | ICD-10-CM

## 2023-12-10 MED ORDER — ELETRIPTAN HYDROBROMIDE 40 MG PO TABS: 40.0000 mg | ORAL_TABLET | ORAL | 10 refills | Status: AC | PRN

## 2023-12-10 MED ORDER — KETOROLAC TROMETHAMINE 60 MG/2ML IM SOLN: 60.0000 mg | Freq: Once | INTRAMUSCULAR | Status: DC

## 2023-12-10 MED ORDER — ONABOTULINUMTOXINA 200 UNITS IJ SOLR
155.0000 [IU] | Freq: Once | INTRAMUSCULAR | Status: AC
  Administered 2023-12-10: 155 [IU] via INTRAMUSCULAR

## 2023-12-10 MED ORDER — METHYLPREDNISOLONE 4 MG PO TBPK
ORAL_TABLET | ORAL | 0 refills | Status: DC
Start: 2023-12-10 — End: 2024-03-09

## 2023-12-10 MED ORDER — KETOROLAC TROMETHAMINE 60 MG/2ML IM SOLN
60.0000 mg | Freq: Once | INTRAMUSCULAR | Status: AC
  Administered 2023-12-10: 60 mg via INTRAMUSCULAR

## 2023-12-10 NOTE — Telephone Encounter (Signed)
 Pt has called back and r/s the missed Botox  appointment

## 2023-12-10 NOTE — Progress Notes (Signed)
 Botox - 200 units x 1 vial Lot: [id number] Expiration: 03/31/2026 NDC: 9976-6078-97  Bacteriostatic 0.9% Sodium Chloride - 4 mL  Lot: [id number] Expiration: 04/01/2025 NDC: 9590-8033-97  Dx: [id number] S/P Witnessed by Stephania Samuel, CMA

## 2023-12-10 NOTE — Telephone Encounter (Signed)
 Pt was r/s to September and wanted to get in sooner. Called pt and rescheduled for this afternoon with Harlene. Advised to check in at 2:45 pm for 3:00 appt.

## 2023-12-10 NOTE — Telephone Encounter (Signed)
 Patient called to reschedule Botox  appointment. Transferred to Billing to pay no show fee oif $50.

## 2023-12-10 NOTE — Addendum Note (Signed)
 Addended by: DELFINO DUNCAN C on: 12/10/2023 04:00 PM   Modules accepted: Orders

## 2023-12-10 NOTE — Telephone Encounter (Signed)
 As a result of pt missing her appointment yesterday she was unable to make her request of Dr Ines to manage her eletriptan  (RELPAX ) 40 MG tablet  and to write a prescription for it to her pharmacy ( [facility] [phone]  ) for her headaches
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
## 2023-12-10 NOTE — Addendum Note (Signed)
 Addended by: DELFINO AUGUSTIN BROCKS on: 12/10/2023 04:04 PM   Modules accepted: Orders

## 2023-12-11 ENCOUNTER — Other Ambulatory Visit: Payer: Self-pay

## 2023-12-16 ENCOUNTER — Ambulatory Visit (INDEPENDENT_AMBULATORY_CARE_PROVIDER_SITE_OTHER)

## 2023-12-16 DIAGNOSIS — J309 Allergic rhinitis, unspecified: Secondary | ICD-10-CM | POA: Diagnosis not present

## 2023-12-18 ENCOUNTER — Ambulatory Visit

## 2023-12-18 DIAGNOSIS — J309 Allergic rhinitis, unspecified: Secondary | ICD-10-CM

## 2023-12-25 ENCOUNTER — Ambulatory Visit (INDEPENDENT_AMBULATORY_CARE_PROVIDER_SITE_OTHER)

## 2023-12-25 DIAGNOSIS — J309 Allergic rhinitis, unspecified: Secondary | ICD-10-CM

## 2024-01-06 ENCOUNTER — Ambulatory Visit (INDEPENDENT_AMBULATORY_CARE_PROVIDER_SITE_OTHER)

## 2024-01-06 DIAGNOSIS — J309 Allergic rhinitis, unspecified: Secondary | ICD-10-CM | POA: Diagnosis not present

## 2024-01-15 ENCOUNTER — Ambulatory Visit (INDEPENDENT_AMBULATORY_CARE_PROVIDER_SITE_OTHER): Admitting: *Deleted

## 2024-01-15 DIAGNOSIS — J309 Allergic rhinitis, unspecified: Secondary | ICD-10-CM | POA: Diagnosis not present

## 2024-01-21 ENCOUNTER — Ambulatory Visit (INDEPENDENT_AMBULATORY_CARE_PROVIDER_SITE_OTHER)

## 2024-01-21 DIAGNOSIS — J309 Allergic rhinitis, unspecified: Secondary | ICD-10-CM

## 2024-01-26 ENCOUNTER — Ambulatory Visit (INDEPENDENT_AMBULATORY_CARE_PROVIDER_SITE_OTHER)

## 2024-01-26 DIAGNOSIS — J309 Allergic rhinitis, unspecified: Secondary | ICD-10-CM | POA: Diagnosis not present

## 2024-01-27 ENCOUNTER — Telehealth: Payer: Self-pay

## 2024-01-27 LAB — STREP PNEUMONIAE 23 SEROTYPES IGG
Pneumo Ab Type 1*: 0.2 ug/mL — AB (ref >1.3)
Pneumo Ab Type 12 (12F)*: <0.1 ug/mL — AB (ref >1.3)
Pneumo Ab Type 14*: 1.8 ug/mL (ref >1.3)
Pneumo Ab Type 17 (17F)*: <0.1 ug/mL — AB (ref >1.3)
Pneumo Ab Type 19 (19F)*: 1.3 ug/mL — AB (ref >1.3)
Pneumo Ab Type 2*: <0.2 ug/mL — AB (ref >1.3)
Pneumo Ab Type 20*: 3.5 ug/mL (ref >1.3)
Pneumo Ab Type 22 (22F)*: 2.2 ug/mL (ref >1.3)
Pneumo Ab Type 23 (23F)*: 1.8 ug/mL (ref >1.3)
Pneumo Ab Type 26 (6B)*: 1.8 ug/mL (ref >1.3)
Pneumo Ab Type 3*: <0.1 ug/mL — AB (ref >1.3)
Pneumo Ab Type 34 (10A)*: <0.1 ug/mL — AB (ref >1.3)
Pneumo Ab Type 4*: 0.3 ug/mL — AB (ref >1.3)
Pneumo Ab Type 43 (11A)*: 0.3 ug/mL — AB (ref >1.3)
Pneumo Ab Type 5*: <0.1 ug/mL — AB (ref >1.3)
Pneumo Ab Type 51 (7F)*: <0.1 ug/mL — AB (ref >1.3)
Pneumo Ab Type 54 (15B)*: 7.4 ug/mL (ref >1.3)
Pneumo Ab Type 56 (18C)*: 0.7 ug/mL — AB (ref >1.3)
Pneumo Ab Type 57 (19A)*: 1.9 ug/mL (ref >1.3)
Pneumo Ab Type 68 (9V)*: 0.4 ug/mL — AB (ref >1.3)
Pneumo Ab Type 70 (33F)*: 3.5 ug/mL (ref >1.3)
Pneumo Ab Type 8*: 0.9 ug/mL — AB (ref >1.3)
Pneumo Ab Type 9 (9N)*: <0.1 ug/mL — AB (ref >1.3)

## 2024-01-27 LAB — CBC WITH DIFFERENTIAL/PLATELET
Basophils Absolute: 0.1 x10E3/uL (ref 0.0–0.2)
Basos: 1 %
EOS (ABSOLUTE): 0.2 x10E3/uL (ref 0.0–0.4)
Eos: 4 %
Hematocrit: 41.9 % (ref 34.0–46.6)
Hemoglobin: 13.9 g/dL (ref 11.1–15.9)
Immature Grans (Abs): 0 x10E3/uL (ref 0.0–0.1)
Immature Granulocytes: 0 %
Lymphocytes Absolute: 1.2 x10E3/uL (ref 0.7–3.1)
Lymphs: 30 %
MCH: 32.8 pg (ref 26.6–33.0)
MCHC: 33.2 g/dL (ref 31.5–35.7)
MCV: 99 fL — ABNORMAL HIGH (ref 79–97)
Monocytes Absolute: 0.4 x10E3/uL (ref 0.1–0.9)
Monocytes: 9 %
Neutrophils Absolute: 2.2 x10E3/uL (ref 1.4–7.0)
Neutrophils: 56 %
Platelets: 289 x10E3/uL (ref 150–450)
RBC: 4.24 x10E6/uL (ref 3.77–5.28)
RDW: 13.2 % (ref 11.7–15.4)
WBC: 4 x10E3/uL (ref 3.4–10.8)

## 2024-01-27 LAB — COMPLEMENT, TOTAL: Compl, Total (CH50): 57 U/mL (ref >41)

## 2024-01-27 LAB — DIPHTHERIA / TETANUS ANTIBODY PANEL
Diphtheria Ab: 0.82 [IU]/mL (ref <0.10)
Tetanus Ab, IgG: 2.44 [IU]/mL (ref <0.10)

## 2024-01-27 LAB — IGG, IGA, IGM
IgA/Immunoglobulin A, Serum: 306 mg/dL (ref 87–352)
IgG (Immunoglobin G), Serum: 1152 mg/dL (ref 586–1602)
IgM (Immunoglobulin M), Srm: 190 mg/dL (ref 26–217)

## 2024-01-27 LAB — IGE: IgE (Immunoglobulin E), Serum: 383 [IU]/mL (ref 6–495)

## 2024-01-27 NOTE — Telephone Encounter (Signed)
 Received onbase communication from [facility]; confirming lab results in chart .

## 2024-01-28 ENCOUNTER — Ambulatory Visit: Payer: Self-pay | Admitting: Allergy

## 2024-01-28 NOTE — Progress Notes (Signed)
 Please call patient.  I reviewed your labwork.  Your blood count, immunoglobulin levels were normal which is great. You also have good protection against diptheria and tetanus.  However, your pneumococcal titers were low. Sometimes people with low titers are more likely to develop respiratory infections caused by the bacteria strep pneumoniae. I would like for you to get the pneumovax 23 vaccine (also known as the pneumonia shot) as it can boost the levels and offer protection against this bacteria in the future. Once you get the vaccine, we check the levels 4 weeks afterwards to make sure your immune system responded to the vaccine appropriately. You can get the pneumovax vaccine at your PCP's office or pharmacy. If they don't offer it there, let us  know and in certain cases we have given them in our office.   Make sure it's the pneumovax 23 vaccine and NOT the prevnar.   Your eosinophils were 200.  You do qualify for various injectables for asthma. Please have patient schedule a follow up visit for this to discuss further as she is due for OV  Thank you.

## 2024-02-04 ENCOUNTER — Ambulatory Visit (INDEPENDENT_AMBULATORY_CARE_PROVIDER_SITE_OTHER)

## 2024-02-04 DIAGNOSIS — J309 Allergic rhinitis, unspecified: Secondary | ICD-10-CM

## 2024-02-09 ENCOUNTER — Ambulatory Visit (INDEPENDENT_AMBULATORY_CARE_PROVIDER_SITE_OTHER)

## 2024-02-09 DIAGNOSIS — J309 Allergic rhinitis, unspecified: Secondary | ICD-10-CM

## 2024-02-09 NOTE — Progress Notes (Signed)
 I called and left a voicemail for a return call in regards to her lab results.

## 2024-02-17 ENCOUNTER — Other Ambulatory Visit (HOSPITAL_COMMUNITY): Payer: Self-pay

## 2024-02-18 ENCOUNTER — Ambulatory Visit (INDEPENDENT_AMBULATORY_CARE_PROVIDER_SITE_OTHER)

## 2024-02-18 DIAGNOSIS — J309 Allergic rhinitis, unspecified: Secondary | ICD-10-CM

## 2024-02-24 ENCOUNTER — Ambulatory Visit: Admitting: Adult Health

## 2024-03-01 ENCOUNTER — Other Ambulatory Visit: Payer: Self-pay

## 2024-03-01 NOTE — Progress Notes (Signed)
 Specialty Pharmacy Refill Coordination Note  STACEYANN KNOUFF is a 53 y.o. female assessed today regarding refills of clinic administered specialty medication(s) OnabotulinumtoxinA    Clinic requested Courier to Provider Office   Delivery date: 03/07/24   Verified address: [facility], [address], [place], [place] [address]   Medication will be filled on 03/04/24.    Appointment 03/09/24.

## 2024-03-03 ENCOUNTER — Ambulatory Visit

## 2024-03-03 ENCOUNTER — Ambulatory Visit: Admitting: Adult Health

## 2024-03-03 ENCOUNTER — Other Ambulatory Visit: Payer: Self-pay

## 2024-03-03 DIAGNOSIS — J309 Allergic rhinitis, unspecified: Secondary | ICD-10-CM

## 2024-03-09 ENCOUNTER — Ambulatory Visit: Admitting: Adult Health

## 2024-03-09 ENCOUNTER — Encounter: Payer: Self-pay | Admitting: Adult Health

## 2024-03-09 VITALS — BP 118/68

## 2024-03-09 DIAGNOSIS — I10 Essential (primary) hypertension: Secondary | ICD-10-CM | POA: Insufficient documentation

## 2024-03-09 DIAGNOSIS — F418 Other specified anxiety disorders: Secondary | ICD-10-CM | POA: Insufficient documentation

## 2024-03-09 DIAGNOSIS — G43709 Chronic migraine without aura, not intractable, without status migrainosus: Secondary | ICD-10-CM

## 2024-03-09 DIAGNOSIS — G43109 Migraine with aura, not intractable, without status migrainosus: Secondary | ICD-10-CM | POA: Insufficient documentation

## 2024-03-09 DIAGNOSIS — G250 Essential tremor: Secondary | ICD-10-CM | POA: Insufficient documentation

## 2024-03-09 MED ORDER — ONABOTULINUMTOXINA 200 UNITS IJ SOLR
155.0000 [IU] | Freq: Once | INTRAMUSCULAR | Status: AC
  Administered 2024-03-09: 155 [IU] via INTRAMUSCULAR

## 2024-03-09 NOTE — Progress Notes (Signed)
 Update 03/09/2024 JM: Patient is being repeat Botox , prior injection 12/10/2023.  Migraines remain very well-controlled with use of Botox .  Currently experiencing about 1 migraine per month, prior to Botox  3 to 4/week.  She also notes recently starting hormone replacement therapy which she feels has been beneficial.  Will use Relpax  for more severe migraine with benefit.  At prior visit, she is experiencing prolonged severe migraine which resolved after Toradol  injection and steroid taper pack.Tolerated procedure well.  Return in 3 months for repeat injection.        Consent Form Botulism Toxin Injection For Chronic Migraine    Reviewed orally with patient, additionally signature is on file:  Botulism toxin has been approved by the Federal drug administration for treatment of chronic migraine. Botulism toxin does not cure chronic migraine and it may not be effective in some patients.  The administration of botulism toxin is accomplished by injecting a small amount of toxin into the muscles of the neck and head. Dosage must be titrated for each individual. Any benefits resulting from botulism toxin tend to wear off after 3 months with a repeat injection required if benefit is to be maintained. Injections are usually done every 3-4 months with maximum effect peak achieved by about 2 or 3 weeks. Botulism toxin is expensive and you should be sure of what costs you will incur resulting from the injection.  The side effects of botulism toxin use for chronic migraine may include:   -Transient, and usually mild, facial weakness with facial injections  -Transient, and usually mild, head or neck weakness with head/neck injections  -Reduction or loss of forehead facial animation due to forehead muscle weakness  -Eyelid drooping  -Dry eye  -Pain at the site of injection or bruising at the site of injection  -Double vision  -Potential unknown long term risks   Contraindications: You should  not have Botox  if you are pregnant, nursing, allergic to albumin, have an infection, skin condition, or muscle weakness at the site of the injection, or have myasthenia gravis, Lambert-Eaton syndrome, or ALS.  It is also possible that as with any injection, there may be an allergic reaction or no effect from the medication. Reduced effectiveness after repeated injections is sometimes seen and rarely infection at the injection site may occur. All care will be taken to prevent these side effects. If therapy is given over a long time, atrophy and wasting in the muscle injected may occur. Occasionally the patient's become refractory to treatment because they develop antibodies to the toxin. In this event, therapy needs to be modified.  I have read the above information and consent to the administration of botulism toxin.    BOTOX  PROCEDURE NOTE FOR MIGRAINE HEADACHE  Contraindications and precautions discussed with patient(above). Aseptic procedure was observed and patient tolerated procedure. Procedure performed by Harlene Bogaert, AGNP-BC.   The condition has existed for more than 6 months, and pt does not have a diagnosis of ALS, Myasthenia Gravis or Lambert-Eaton Syndrome.  Risks and benefits of injections discussed and pt agrees to proceed with the procedure.  Written consent obtained  These injections are medically necessary. Pt  receives good benefits from these injections. These injections do not cause sedations or hallucinations which the oral therapies may cause.   Description of procedure:  The patient was placed in a sitting position. The standard protocol was used for Botox  as follows, with 5 units of Botox  injected at each site:  -Procerus muscle, midline injection  -  Corrugator muscle, bilateral injection  -Frontalis muscle, bilateral injection, with 2 sites each side, medial injection was performed in the upper one third of the frontalis muscle, in the region vertical from the  medial inferior edge of the superior orbital rim. The lateral injection was again in the upper one third of the forehead vertically above the lateral limbus of the cornea, 1.5 cm lateral to the medial injection site.  -Temporalis muscle injection, 4 sites, bilaterally. The first injection was 3 cm above the tragus of the ear, second injection site was 1.5 cm to 3 cm up from the first injection site in line with the tragus of the ear. The third injection site was 1.5-3 cm forward between the first 2 injection sites. The fourth injection site was 1.5 cm posterior to the second injection site. 5th site laterally in the temporalis  muscleat the level of the outer canthus.  -Occipitalis muscle injection, 3 sites, bilaterally. The first injection was done one half way between the occipital protuberance and the tip of the mastoid process behind the ear. The second injection site was done lateral and superior to the first, 1 fingerbreadth from the first injection. The third injection site was 1 fingerbreadth superiorly and medially from the first injection site.  -Cervical paraspinal muscle injection, 2 sites, bilaterally. The first injection site was 1 cm from the midline of the cervical spine, 3 cm inferior to the lower border of the occipital protuberance. The second injection site was 1.5 cm superiorly and laterally to the first injection site.  -Trapezius muscle injection was performed at 3 sites, bilaterally. The first injection site was in the upper trapezius muscle halfway between the inflection point of the neck, and the acromion. The second injection site was one half way between the acromion and the first injection site. The third injection was done between the first injection site and the inflection point of the neck.    A total of 200 units of Botox  was prepared, 155 units of Botox  was injected as documented above, any Botox  not injected was wasted. The patient tolerated the procedure well, there  were no complications of the above procedure.   Harlene Bogaert, AGNP-BC  [facility] [address] [place], [place] [address]  Phone [phone] Fax [phone] Note: This document was prepared with digital dictation and possible smart phrase technology. Any transcriptional errors that result from this process are unintentional.

## 2024-03-09 NOTE — Progress Notes (Signed)
 botox - 200 units x 1 vial Lot: [id number] Expiration: 04/2026 NDC: 9976-6078-97   Bacteriostatic 0.9% Sodium Chloride - 4 mL  Lot: [id number] Expiration: 04/01/2025 NDC: 9590-8033-97   Dx: [id number] S/P Witnessed by Rojean DEL

## 2024-03-20 NOTE — Progress Notes (Deleted)
 Follow Up Note  RE: Jenna Allison MRN: [id number] DOB: Apr 07, 1971 Date of Office Visit: 03/21/2024  Referring provider: Wonda Worth SQUIBB, PA Primary care provider: Wonda Worth SQUIBB, PA  Chief Complaint: No chief complaint on file.  History of Present Illness: I had the pleasure of seeing Jenna Allison for a follow up visit at the [facility] on 03/21/2024. Jenna Allison is a 53 y.o. female, who is being followed for asthma, allergic rhinitis on AIT, recurrent infections. Jenna Allison previous allergy  office visit was on 08/24/2023 with Dr. Luke. Today is a regular follow up visit.  Discussed the use of AI scribe software for clinical note transcription with the Jenna Allison, who gave verbal consent to proceed.  History of Present Illness            2025 labs: I reviewed your labwork.  Your blood count, immunoglobulin levels were normal which is great. You also have good protection against diptheria and tetanus.   However, your pneumococcal titers were low. Sometimes people with low titers are more likely to develop respiratory infections caused by the bacteria strep pneumoniae. I would like for you to get the pneumovax 23 vaccine (also known as the pneumonia shot) as it can boost the levels and offer protection against this bacteria in the future. Once you get the vaccine, we check the levels 4 weeks afterwards to make sure your immune system responded to the vaccine appropriately. You can get the pneumovax vaccine at your PCP's office or pharmacy. If they don't offer it there, let us  know and in certain cases we have given them in our office.    Make sure it's the pneumovax 23 vaccine and NOT the prevnar.    Your eosinophils were 200.  You do qualify for various injectables for asthma. Please have Jenna Allison schedule a follow up visit for this to discuss further as Jenna Allison is due for OV  Assessment and Plan: Jenna Allison is a 53 y.o. female with: Severe persistent asthma without complication Past  history - asthma for 30+ years and main triggers are allergies. Lately using albuterol  3-5 times per week despite being on Advair maintenance. Used to follow with pulm. 2024 spirometry showed: normal pattern with no improvement in FEV1 post bronchodilator treatment. Clinically feeling improved. Trelegy caused thrush.  Interim history - had a flare requiring 2 courses of prednisone . Usually has a flare in the fall and spring. No thrush. Today's spirometry was unremarkable - improved.  Get bloodwork in about 4 weeks to see what biologics (injections) for asthma you qualify for. Just finished prednisone  and antibiotics.  Daily controller medication(s): Breztri  2 puffs twice a day with spacer and rinse mouth afterwards. Use alcohol free Listerine mouthwash. May use Airsupra  rescue inhaler 2 puffs every 4 to 6 hours as needed for shortness of breath, chest tightness, coughing, and wheezing. Do not use more than 12 puffs in 24 hours. May use Airsupra  rescue inhaler 2 puffs 5 to 15 minutes prior to strenuous physical activities. Rinse mouth after each use.  Monitor frequency of use - if you need to use it more than twice per week on a consistent basis let us  know.    Seasonal allergic rhinitis due to pollen Allergic rhinitis due to animal dander Allergic rhinitis due to dust mite Allergic rhinitis due to mold Allergy  to cockroaches Past history - perennial rhino conjunctivitis symptoms which flare in the spring and fall. On AIT in [place]  for 3 years 18 years ago with good benefit  and on AIT in [place] for 3 years 10+ years ago with good benefit. Wants to restart injections as allergies seem to trigger Jenna Allison headaches and asthma as well.  2024 skin testing positive to grass, weed, trees, mold, dust mites, cat, dog, horse, cockroaches. Started AIT on 06/18/2023 (G-[id number]-DM and M-C-D-CR).  Interim history - flares in the spring and fall.  Continue environmental control measures.  Use over the counter antihistamines  such as Zyrtec (cetirizine), Claritin (loratadine), Allegra  (fexofenadine ), or Xyzal (levocetirizine) daily as needed. May take twice a day during allergy  flares. May switch antihistamines every few months. Use Flonase (fluticasone ) nasal spray 1-2 sprays per nostril once a day as needed for nasal congestion.  Nasal saline spray (i.e., Simply Saline) or nasal saline lavage (i.e., NeilMed) is recommended as needed and prior to medicated nasal sprays. Continue allergy  injections - given today.   Recurrent infections Past history - 1-2 sinus infections and bronchitis per year. Interim history - 2 courses of antibiotics.  Keep track of infections and antibiotics use. Get bloodwork next to look at immune system.  Assessment and Plan              No follow-ups on file.  No orders of the defined types were placed in this encounter.  Lab Orders  No laboratory test(s) ordered today    Diagnostics: Spirometry:  Tracings reviewed. Jenna Allison effort: {Blank single:19197::Good reproducible efforts.,It was hard to get consistent efforts and there is a question as to whether this reflects a maximal maneuver.,Poor effort, data can not be interpreted.} FVC: ***L FEV1: ***L, ***% predicted FEV1/FVC ratio: ***% Interpretation: {Blank single:19197::Spirometry consistent with mild obstructive disease,Spirometry consistent with moderate obstructive disease,Spirometry consistent with severe obstructive disease,Spirometry consistent with possible restrictive disease,Spirometry consistent with mixed obstructive and restrictive disease,Spirometry uninterpretable due to technique,Spirometry consistent with normal pattern,No overt abnormalities noted given today's efforts}.  Please see scanned spirometry results for details.  Skin Testing: {Blank single:19197::Select foods,Environmental allergy  panel,Environmental allergy  panel and select foods,Food allergy  panel,None,Deferred due to  recent antihistamines use}. *** Results discussed with Jenna Allison/family.   Medication List:  Current Outpatient Medications  Medication Sig Dispense Refill   Albuterol -Budesonide (AIRSUPRA ) 90-80 MCG/ACT AERO Inhale 2 puffs into the lungs every 4 (four) hours as needed (coughing, wheezing, chest tightness). Do not exceed 12 puffs in 24 hours. 10.7 g 2   botulinum toxin Type A  (BOTOX ) 200 units injection Provider to inject 155 units into the muscles of the head and neck every 12 weeks. Discard remainder. 1 each 3   Budeson-Glycopyrrol-Formoterol (BREZTRI  AEROSPHERE) 160-9-4.8 MCG/ACT AERO Inhale 2 puffs into the lungs in the morning and at bedtime. with spacer and rinse mouth afterwards. 10.7 g 3   buPROPion (WELLBUTRIN XL) 150 MG 24 hr tablet Take 150 mg by mouth daily.   2   cetirizine (ZYRTEC ALLERGY ) 10 MG tablet Take 10 mg by mouth daily.     citalopram (CELEXA) 40 MG tablet Take 40 mg by mouth daily.   2   eletriptan  (RELPAX ) 40 MG tablet Take 1 tablet (40 mg total) by mouth every 2 (two) hours as needed for migraine. Max 2 tab/ 24hours 10 tablet 10   EPINEPHrine  0.3 mg/0.3 mL IJ SOAJ injection Inject 0.3 mg into the muscle as needed for anaphylaxis. 2 each 1   estradiol (VIVELLE-DOT) 0.05 MG/24HR patch Place 1 patch onto the skin 2 (two) times a week.     fluticasone  (FLONASE) 50 MCG/ACT nasal spray Place 1 spray into both nostrils daily.  gabapentin (NEURONTIN) 100 MG capsule TAKE 1 CAPSULE BY MOUTH 3 TIMES A DAY AS NEEDED FOR ANXIETY     Multiple Vitamins-Minerals (MULTIVITAMIN ADULT) TABS Take 1 tablet by mouth daily.     OnabotulinumtoxinA  (BOTOX  IJ) Inject as directed. Every 3 months for Migraines     progesterone (PROMETRIUM) 100 MG capsule Take 100 mg by mouth daily.     propranolol (INDERAL) 20 MG tablet Take 20 mg by mouth 2 (two) times daily.     spironolactone (ALDACTONE) 50 MG tablet Take 50 mg by mouth daily.     Topiramate (TOPAMAX PO) Take 100 mg by mouth daily.      No current facility-administered medications for this visit.   Allergies: No Known Allergies I reviewed Jenna Allison past medical history, social history, family history, and environmental history and no significant changes have been reported from Jenna Allison previous visit.  Review of Systems  Constitutional:  Negative for appetite change, chills, fever and unexpected weight change.  HENT:  Negative for congestion and rhinorrhea.   Eyes:  Negative for itching.  Respiratory:  Negative for cough, chest tightness, shortness of breath and wheezing.   Cardiovascular:  Negative for chest pain.  Gastrointestinal:  Negative for abdominal pain.  Genitourinary:  Negative for difficulty urinating.  Skin:  Negative for rash.  Allergic/Immunologic: Positive for environmental allergies.    Objective: There were no vitals taken for this visit. There is no height or weight on file to calculate BMI. Physical Exam Vitals and nursing note reviewed.  Constitutional:      Appearance: Normal appearance. Jenna Allison is well-developed.  HENT:     Head: Normocephalic and atraumatic.     Right Ear: Tympanic membrane and external ear normal.     Left Ear: Tympanic membrane and external ear normal.     Nose: Nose normal.     Mouth/Throat:     Mouth: Mucous membranes are moist.  Eyes:     Conjunctiva/sclera: Conjunctivae normal.  Cardiovascular:     Rate and Rhythm: Normal rate and regular rhythm.     Heart sounds: Normal heart sounds. No murmur heard.    No friction rub. No gallop.  Pulmonary:     Effort: Pulmonary effort is normal.     Breath sounds: Normal breath sounds. No wheezing, rhonchi or rales.  Musculoskeletal:     Cervical back: Neck supple.  Skin:    General: Skin is warm.     Findings: No rash.  Neurological:     Mental Status: Jenna Allison is alert and oriented to person, place, and time.  Psychiatric:        Behavior: Behavior normal.    Previous notes and tests were reviewed. The plan was reviewed with  the Jenna Allison/family, and all questions/concerned were addressed.  It was my pleasure to see Jenna Allison today and participate in Jenna Allison care. Please feel free to contact me with any questions or concerns.  Sincerely,  Orlan Cramp, DO Allergy  & Immunology  [facility]  [place] office: [phone] [facility] office: [phone]

## 2024-03-21 ENCOUNTER — Ambulatory Visit: Payer: Self-pay | Admitting: Allergy

## 2024-03-21 DIAGNOSIS — Z91038 Other insect allergy status: Secondary | ICD-10-CM

## 2024-03-21 DIAGNOSIS — J301 Allergic rhinitis due to pollen: Secondary | ICD-10-CM

## 2024-03-21 DIAGNOSIS — J3081 Allergic rhinitis due to animal (cat) (dog) hair and dander: Secondary | ICD-10-CM

## 2024-03-21 DIAGNOSIS — J309 Allergic rhinitis, unspecified: Secondary | ICD-10-CM

## 2024-03-21 DIAGNOSIS — B999 Unspecified infectious disease: Secondary | ICD-10-CM

## 2024-03-21 DIAGNOSIS — J3089 Other allergic rhinitis: Secondary | ICD-10-CM

## 2024-03-21 DIAGNOSIS — J4550 Severe persistent asthma, uncomplicated: Secondary | ICD-10-CM

## 2024-03-23 ENCOUNTER — Ambulatory Visit

## 2024-03-23 DIAGNOSIS — J309 Allergic rhinitis, unspecified: Secondary | ICD-10-CM | POA: Diagnosis not present

## 2024-04-01 ENCOUNTER — Ambulatory Visit (INDEPENDENT_AMBULATORY_CARE_PROVIDER_SITE_OTHER)

## 2024-04-01 DIAGNOSIS — J309 Allergic rhinitis, unspecified: Secondary | ICD-10-CM

## 2024-04-26 ENCOUNTER — Ambulatory Visit (INDEPENDENT_AMBULATORY_CARE_PROVIDER_SITE_OTHER)

## 2024-04-26 DIAGNOSIS — J309 Allergic rhinitis, unspecified: Secondary | ICD-10-CM

## 2024-05-04 DIAGNOSIS — J302 Other seasonal allergic rhinitis: Secondary | ICD-10-CM | POA: Diagnosis not present

## 2024-05-04 DIAGNOSIS — J301 Allergic rhinitis due to pollen: Secondary | ICD-10-CM | POA: Diagnosis not present

## 2024-05-04 DIAGNOSIS — J3089 Other allergic rhinitis: Secondary | ICD-10-CM | POA: Diagnosis not present

## 2024-05-04 DIAGNOSIS — J3081 Allergic rhinitis due to animal (cat) (dog) hair and dander: Secondary | ICD-10-CM | POA: Diagnosis not present

## 2024-05-04 NOTE — Progress Notes (Signed)
 VIALS MADE ON 05/04/24

## 2024-05-12 ENCOUNTER — Ambulatory Visit (INDEPENDENT_AMBULATORY_CARE_PROVIDER_SITE_OTHER): Admitting: *Deleted

## 2024-05-12 DIAGNOSIS — J309 Allergic rhinitis, unspecified: Secondary | ICD-10-CM | POA: Diagnosis not present

## 2024-05-18 ENCOUNTER — Other Ambulatory Visit: Payer: Self-pay

## 2024-05-31 ENCOUNTER — Other Ambulatory Visit: Payer: Self-pay

## 2024-05-31 ENCOUNTER — Other Ambulatory Visit (HOSPITAL_COMMUNITY): Payer: Self-pay

## 2024-05-31 NOTE — Progress Notes (Signed)
 Specialty Pharmacy Refill Coordination Note  Spoke with Jenna Allison is a 53 y.o. female contacted today regarding refills of specialty medication(s) OnabotulinumtoxinA   Injection appointment: 06/09/24  Patient requested: Courier to Provider Office   Delivery date: 06/06/24   Verified address: [facility] [address] [place] [address]  Medication will be filled on 06/03/24

## 2024-06-09 ENCOUNTER — Ambulatory Visit: Admitting: Adult Health

## 2024-06-10 ENCOUNTER — Ambulatory Visit

## 2024-06-10 DIAGNOSIS — J302 Other seasonal allergic rhinitis: Secondary | ICD-10-CM

## 2024-06-17 ENCOUNTER — Ambulatory Visit (INDEPENDENT_AMBULATORY_CARE_PROVIDER_SITE_OTHER): Admitting: *Deleted

## 2024-06-17 DIAGNOSIS — J302 Other seasonal allergic rhinitis: Secondary | ICD-10-CM | POA: Diagnosis not present

## 2024-06-23 ENCOUNTER — Ambulatory Visit

## 2024-06-23 DIAGNOSIS — J302 Other seasonal allergic rhinitis: Secondary | ICD-10-CM

## 2024-07-01 ENCOUNTER — Ambulatory Visit

## 2024-07-01 DIAGNOSIS — J302 Other seasonal allergic rhinitis: Secondary | ICD-10-CM

## 2024-07-08 ENCOUNTER — Ambulatory Visit (INDEPENDENT_AMBULATORY_CARE_PROVIDER_SITE_OTHER)

## 2024-07-08 DIAGNOSIS — J302 Other seasonal allergic rhinitis: Secondary | ICD-10-CM

## 2024-08-29 ENCOUNTER — Ambulatory Visit: Admitting: Adult Health
